# Patient Record
Sex: Female | Born: 1950 | Race: White | Hispanic: No | Smoking: Never smoker
Health system: Southern US, Community
[De-identification: ages and names within clinical notes are randomized; demographics above are authoritative.]

## PROBLEM LIST (undated history)

## (undated) DIAGNOSIS — I87009 Postthrombotic syndrome without complications of unspecified extremity: Secondary | ICD-10-CM

## (undated) DIAGNOSIS — R0600 Dyspnea, unspecified: Secondary | ICD-10-CM

## (undated) DIAGNOSIS — J189 Pneumonia, unspecified organism: Secondary | ICD-10-CM

## (undated) DIAGNOSIS — I2699 Other pulmonary embolism without acute cor pulmonale: Secondary | ICD-10-CM

## (undated) DIAGNOSIS — J45909 Unspecified asthma, uncomplicated: Secondary | ICD-10-CM

## (undated) DIAGNOSIS — J309 Allergic rhinitis, unspecified: Secondary | ICD-10-CM

## (undated) DIAGNOSIS — I10 Essential (primary) hypertension: Secondary | ICD-10-CM

## (undated) HISTORY — DX: Unspecified asthma, uncomplicated: J45.909

## (undated) HISTORY — PX: SHOULDER ARTHROSCOPY: SHX128

## (undated) HISTORY — PX: VESICOVAGINAL FISTULA CLOSURE W/ TAH: SUR271

## (undated) HISTORY — PX: BLADDER REPAIR: SHX76

## (undated) HISTORY — DX: Allergic rhinitis, unspecified: J30.9

## (undated) HISTORY — DX: Other pulmonary embolism without acute cor pulmonale: I26.99

## (undated) HISTORY — PX: APPENDECTOMY: SHX54

## (undated) HISTORY — DX: Postthrombotic syndrome without complications of unspecified extremity: I87.009

## (undated) HISTORY — PX: TONSILLECTOMY: SUR1361

---

## 2001-05-27 ENCOUNTER — Other Ambulatory Visit: Admission: RE | Admit: 2001-05-27 | Discharge: 2001-05-27 | Payer: Self-pay | Admitting: Family Medicine

## 2001-08-03 ENCOUNTER — Encounter (INDEPENDENT_AMBULATORY_CARE_PROVIDER_SITE_OTHER): Payer: Self-pay

## 2001-08-03 ENCOUNTER — Inpatient Hospital Stay (HOSPITAL_COMMUNITY): Admission: RE | Admit: 2001-08-03 | Discharge: 2001-08-06 | Payer: Self-pay | Admitting: Obstetrics and Gynecology

## 2002-01-12 ENCOUNTER — Inpatient Hospital Stay (HOSPITAL_COMMUNITY): Admission: EM | Admit: 2002-01-12 | Discharge: 2002-01-15 | Payer: Self-pay | Admitting: Emergency Medicine

## 2002-01-12 ENCOUNTER — Encounter: Payer: Self-pay | Admitting: Internal Medicine

## 2003-03-11 ENCOUNTER — Encounter: Payer: Self-pay | Admitting: Emergency Medicine

## 2003-03-11 ENCOUNTER — Emergency Department (HOSPITAL_COMMUNITY): Admission: EM | Admit: 2003-03-11 | Discharge: 2003-03-12 | Payer: Self-pay | Admitting: Emergency Medicine

## 2005-06-15 ENCOUNTER — Encounter: Payer: Self-pay | Admitting: Emergency Medicine

## 2005-06-15 ENCOUNTER — Inpatient Hospital Stay (HOSPITAL_COMMUNITY): Admission: AD | Admit: 2005-06-15 | Discharge: 2005-06-23 | Payer: Self-pay | Admitting: Internal Medicine

## 2005-08-05 ENCOUNTER — Ambulatory Visit: Payer: Self-pay | Admitting: Internal Medicine

## 2005-09-18 ENCOUNTER — Ambulatory Visit: Payer: Self-pay | Admitting: Internal Medicine

## 2005-11-21 ENCOUNTER — Ambulatory Visit: Payer: Self-pay | Admitting: Internal Medicine

## 2006-05-22 ENCOUNTER — Ambulatory Visit: Payer: Self-pay | Admitting: Internal Medicine

## 2006-12-02 ENCOUNTER — Ambulatory Visit: Payer: Self-pay | Admitting: Internal Medicine

## 2007-09-18 ENCOUNTER — Inpatient Hospital Stay (HOSPITAL_COMMUNITY): Admission: EM | Admit: 2007-09-18 | Discharge: 2007-09-25 | Payer: Self-pay | Admitting: Emergency Medicine

## 2007-09-20 ENCOUNTER — Telehealth: Payer: Self-pay | Admitting: Internal Medicine

## 2007-10-04 DIAGNOSIS — J4540 Moderate persistent asthma, uncomplicated: Secondary | ICD-10-CM

## 2007-10-04 DIAGNOSIS — J302 Other seasonal allergic rhinitis: Secondary | ICD-10-CM

## 2007-10-04 DIAGNOSIS — M810 Age-related osteoporosis without current pathological fracture: Secondary | ICD-10-CM | POA: Insufficient documentation

## 2007-10-04 DIAGNOSIS — J3089 Other allergic rhinitis: Secondary | ICD-10-CM

## 2007-10-05 ENCOUNTER — Ambulatory Visit: Payer: Self-pay | Admitting: Internal Medicine

## 2007-10-05 DIAGNOSIS — I2699 Other pulmonary embolism without acute cor pulmonale: Secondary | ICD-10-CM | POA: Insufficient documentation

## 2007-12-07 ENCOUNTER — Ambulatory Visit: Payer: Self-pay | Admitting: Internal Medicine

## 2008-02-14 ENCOUNTER — Ambulatory Visit (HOSPITAL_COMMUNITY): Admission: RE | Admit: 2008-02-14 | Discharge: 2008-02-14 | Payer: Self-pay | Admitting: Orthopedic Surgery

## 2008-02-25 ENCOUNTER — Ambulatory Visit (HOSPITAL_COMMUNITY): Admission: RE | Admit: 2008-02-25 | Discharge: 2008-02-25 | Payer: Self-pay | Admitting: Orthopedic Surgery

## 2008-03-14 ENCOUNTER — Ambulatory Visit: Payer: Self-pay | Admitting: Internal Medicine

## 2009-01-02 ENCOUNTER — Observation Stay (HOSPITAL_COMMUNITY): Admission: EM | Admit: 2009-01-02 | Discharge: 2009-01-03 | Payer: Self-pay | Admitting: Emergency Medicine

## 2009-01-02 ENCOUNTER — Encounter: Payer: Self-pay | Admitting: Internal Medicine

## 2009-01-04 ENCOUNTER — Telehealth: Payer: Self-pay | Admitting: Internal Medicine

## 2009-01-05 ENCOUNTER — Ambulatory Visit: Payer: Self-pay | Admitting: Internal Medicine

## 2009-01-05 DIAGNOSIS — L509 Urticaria, unspecified: Secondary | ICD-10-CM

## 2009-05-08 ENCOUNTER — Ambulatory Visit: Payer: Self-pay | Admitting: Internal Medicine

## 2009-05-08 LAB — CONVERTED CEMR LAB: IgE (Immunoglobulin E), Serum: 25.4 intl units/mL (ref 0.0–180.0)

## 2009-09-25 ENCOUNTER — Ambulatory Visit: Payer: Self-pay | Admitting: Internal Medicine

## 2010-04-22 ENCOUNTER — Ambulatory Visit: Payer: Self-pay | Admitting: Internal Medicine

## 2010-06-04 ENCOUNTER — Telehealth: Payer: Self-pay | Admitting: Internal Medicine

## 2010-09-03 NOTE — Assessment & Plan Note (Signed)
Summary: 6 months/apc   Primary Andrea [name]/Referring Andrea [name]:  Andrea [name]  CC:  6 month follow up.  Pt states breathing is doing "really well overall."  Occ nonprod cough.  Denies SOB, wheezing, and chest tightness. Marland Kitchen  History of Present Illness:  May 08, 2009- allergic rhinitis, asthma, urticaria No major events since here in June. She does note increased wheezey cough and red eyes starting 3-4 weeks ago, standard for Fall. On some days she uses neb 1-2 times, and we reviewed her meds. No prednisone since ER in June.She works from home without exposures unless she goes outside. Had pneumovax 8 years ago and we discussed waiting til 66 to repeat.She will get flu vax and routine labs at pending annual exam. had CXR at [name] ER in June .We discussed Xolair - never allergy tested.  September 25, 2009- Allergic rhinitis, asthma, urticaria.  Had flu vax in November, but had a flu-like illness at first of February with temp 102, diffuse myalgias, dry cough, no GI. She toolk a prednisone burst which helped. Now resolved. She had a lot of exposure to small children working at [employer]. No routine sheeze or cough. Good peak flow is 500 and she dropped with the recent illness to about 400. She has a full medication tool kit and uses her meds appropriately. Working at home has helped more than anything.  April 22, 2010- Allergic rhinitis, asthma, urticaria By working at home she was able to avoid being out in the heat much. Now with Fall weather has had a little itching of her eyes. Has needed acute benadryl- in  the last 2 years she has noted with rain she would wake itching and wheezing.  She has used nebulizer but not needed ER or Epipen. 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  Flu vaccine discussed.   Asthma History    Initial Asthma Severity Rating:    Age range: 12+ years    Symptoms: >2 days/week; not daily    Nighttime  Awakenings: 0-2/month    Interferes w/ normal activity: no limitations    SABA use (not for EIB): 0-2 days/week    Asthma Severity Assessment: Mild Persistent   Preventive Screening-Counseling & Management  Alcohol-Tobacco     Smoking Status: never  Current Medications (verified): 1)  Allegra 180 Mg  Tabs (Fexofenadine Hcl) .... Take 1 Tablet By Mouth Once A Day 2)  Singulair 10 Mg  Tabs (Montelukast Sodium) .... Take 1 Tablet By Mouth Once A Day 3)  Flonase 50 Mcg/act  Susp (Fluticasone Propionate) .... Use As Directed 4)  Simvastatin 80 Mg  Tabs (Simvastatin) .... Take 1/2  Tab By Mouth At Bedtime 5)  Calcium 500/d 500-200 Mg-Unit  Tabs (Calcium Carbonate-Vitamin D) .... Take 2  Tablet By Mouth Once A Day 6)  Fosamax 70 Mg  Tabs (Alendronate Sodium) .... Once A Week 7)  Spiriva Handihaler 18 Mcg  Caps (Tiotropium Bromide Monohydrate) .... Inhale Contents of 1 Capsule Once A Day 8)  Foradil Aerolizer 12 Mcg  Caps (Formoterol Fumarate) .... Take 1 Capsule By Mouth Two Times A Day 9)  Asmanex 60 Metered Doses 220 Mcg/inh  Aepb (Mometasone Furoate) .... Inhale 1 Puff Two Times A Day 10)  Vitamin D 14782 Unit  Caps (Ergocalciferol) .... Twice Month 11)  Ventolin Hfa 108 (90 Base) Mcg/act Aers (Albuterol Sulfate) .... 2 Puffs Four Times A Day As Needed 12)  Albuterol Sulfate 0.63 Mg/63ml  Nebu (Albuterol Sulfate) .Marland Kitchen.. 1 Neb Four Times A Day As Needed 13)  Epipen 0.3 Mg/0.40ml (1:1000) Devi (Epinephrine Hcl (Anaphylaxis)) .... As Directed For Severe Allergic Episode 14)  Tylenol Extra Strength 500 Mg Tabs (Acetaminophen) .... As Needed  Allergies (verified): 1)  ! * Avelox 2)  ! 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  Father- no medical hx Sibling 1- living age 78; no medical hx Sibling 2- living age 76; severe asthma, bleeding disorder  Social History: Last updated: 03/20/2008 Patient never smoked.  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 ETOH- 1/ month Negative history of passive tobacco smoke exposure.   Risk Factors: Smoking Status: never (04/22/2010) Passive Smoke Exposure: no (03/20/2008)  Review of Systems      See HPI       The patient complains of nasal congestion/difficulty breathing through nose.  The patient denies shortness of breath with activity, shortness of breath at rest, productive cough, non-productive cough, coughing up blood, chest pain, irregular heartbeats, acid heartburn, indigestion, loss of appetite, weight change, abdominal pain, difficulty swallowing, sore throat, tooth/dental problems, headaches, and sneezing.    Vital Signs:  Patient profile:   60 year old female Height:      63.5 inches Weight:      175 pounds BMI:     30.62 O2 Sat:      98 % on Room air Pulse rate:   90 / minute BP sitting:   120 / 90  (left arm) Cuff size:   regular  Vitals Entered By: Andrea Dimitri RN (April 22, 2010 9:21 AM)  O2 Flow:  Room air CC: 6 month follow up.  Pt states breathing is doing "really well overall."  Occ nonprod cough.  Denies SOB, wheezing, chest tightness.  Comments Medications reviewed with patient Daytime contact number verified with patient. Andrea Dimitri RN  April 22, 2010 9:21 AM    Physical Exam  Additional Exam:  General: A/Ox3; pleasant and cooperative, NAD, SKIN: no rash, lesions NODES: no lymphadenopathy HEENT: [place]/AT, EOM- WNL, Conjuctivae- clear,  PERRLA, TM-WNL, Nose- clear, Throat- clear and wnl NECK: Supple w/ fair ROM, JVD- none, normal carotid impulses w/o bruits Thyroid- normal to palpation CHEST: Clear to P&A, no wheeze or cough HEART: RRR, no m/g/r heard ABDOMEN: Soft and nl; overweight DVV:OHYW, nl pulses, chronic stasis changes NEURO: Grossly intact to observation      Impression & Recommendations:  Problem # 1:  URTICARIA (ICD-708.9)  She associates rare/ once a year episode of hives and wheeze with mold exposure as it rains. I don't have a better way to manage for now than what she is doing.   Problem # 2:  ASTHMA (ICD-493.90) Better control. A key issue for her is environmental control that she has at home- avoiding respiratory irritants.  I will let her check coverage on Advair 250 now since she is concerned about cost of Foradil.  Problem # 3:  ALLERGIC RHINITIS (ICD-477.9)  Good control of rhinitis now. Her updated medication list for this problem includes:    Allegra 180 Mg  Tabs (Fexofenadine hcl) .Marland Kitchen... Take 1 tablet by mouth once a day    Flonase 50 Mcg/act Susp (Fluticasone propionate) ..... Use as directed  Medications Added to Medication List This Visit: 1)  Advair Diskus 250-50 Mcg/dose Aepb (Fluticasone-salmeterol) .Marland Kitchen.. 1 puff and rinse, twice daily 2)  Prednisone 10 Mg Tabs (Prednisone) .Marland Kitchen.. 1 tab four times daily x 2 days, 3 times daily x 2 days, 2 times daily x 2 days, 1 time daily x 2 days  Other Orders: Est. Patient Level IV (16109) Admin 1st Vaccine (60454) Flu Vaccine 100yrs + (09811)  Patient Instructions: 1)  Please schedule a follow-up appointment in 6 months. 2)  Script for prednsone burst to hold 3)  Script for Advair 250. Ask your pharmacist how your insurance would handle this, as opposed to Foradil plus Asmanex 4)  Refill Epipen 5)  Flu vax Prescriptions: PREDNISONE 10 MG TABS (PREDNISONE) 1 tab four times daily x 2 days, 3 times daily x 2 days, 2 times daily x 2 days, 1 time  daily x 2 days  #20 x 1   Entered and Authorized by:   Waymon Budge MD   Signed by:   Waymon Budge MD on 04/22/2010   Method used:   Print then Give to Patient   RxID:   [id number] EPIPEN 0.3 MG/0.3ML (1:1000) DEVI (EPINEPHRINE HCL (ANAPHYLAXIS)) as directed for severe allergic episode  #1 x prn   Entered and Authorized by:   Waymon Budge MD   Signed by:   Waymon Budge MD on 04/22/2010   Method used:   Print then Give to Patient   RxID:   [id number] ADVAIR DISKUS 250-50 MCG/DOSE AEPB (FLUTICASONE-SALMETEROL) 1 puff and rinse, twice daily  #1 x prn   Entered and Authorized by:   Waymon Budge MD   Signed by:   Waymon Budge MD on 04/22/2010   Method used:   Print then Give to Patient   RxID:   [id number]  Flu Vaccine Consent Questions     Do you have a history of severe allergic reactions to this vaccine? no    Any prior history of allergic reactions to egg and/or gelatin? no    Do you have a sensitivity to the preservative Thimersol? no    Do you have a past history of Guillan-Barre Syndrome? no    Do you currently have an acute febrile illness? no    Have you ever had a severe reaction to latex? no    Vaccine information given and explained to patient? yes    Are you currently pregnant? no    Lot Number:[id number]   Exp Date:02/01/2011   Site Given  Left Deltoid IM [facility], LPN  April 22, 2010 10:29 AM  Immunization History:  Pneumovax Immunization History:    Pneumovax:  historical (06/04/2001)  .lbflu

## 2010-09-03 NOTE — Assessment & Plan Note (Signed)
Summary: rov/apc   Primary Provider/Referring Provider:  [name]  CC:  Pt here for follow up. Pt c/o T-102 x 2 days first of this month with bodyaches. Pt states is "really well' since last OV.  History of Present Illness: 01/05/09- Allergic rhinitis, asthma, urticaria 3 episodes of urticaria wih itching since March- never before. Blamed "pollen" in March/ April. Latest one was 3 days ago. some wheeze with last episode. Went to Urgent Care and was sent to ER [facility] , kept overnight for obs. Now taking allegra 180, Singulair, and added zyrtec 10 mg yesterday.  Now on 60 mg prednisone daily. Peak flow is back to normal- 480- 500 and no hives or itching.  May 08, 2009- allergic rhinitis, asthma, urticaria No major events since here in June. She does note increased wheezey cough and red eyes starting 3-4 weeks ago, standard for Fall. On some days she uses neb 1-2 times, and we reviewed her meds. No prednisone since ER in June.She works from home without exposures unless she goes outside. Had pneumovax 8 years ago and we discussed waiting til 66 to repeat.She will get flu vax and routine labs at pending annual exam. had CXR at [facility] ER in June .We discussed Xolair - never allergy tested.  September 25, 2009- Allergic rhinitis, asthma, urticaria.  Had flu vax in November, but had a flu-like illness at first of February with temp 102, diffuse myalgias, dry cough, no GI. She toolk a prednisone burst which helped. Now resolved. She had a lot of exposure to small children working at [employer]. No routine sheeze or cough. Good peak flow is 500 and she dropped with the recent illness to about 400. She has a full medication tool kit and uses her meds appropriately. Working at home has helped more than anything.    Current Medications (verified): 1)  Allegra 180 Mg  Tabs (Fexofenadine Hcl) .... Take 1 Tablet By Mouth Once A Day 2)  Singulair 10 Mg  Tabs (Montelukast Sodium) .... Take 1 Tablet By Mouth  Once A Day 3)  Flonase 50 Mcg/act  Susp (Fluticasone Propionate) .... Use As Directed 4)  Simvastatin 80 Mg  Tabs (Simvastatin) .... Take 1/2  Tab By Mouth At Bedtime 5)  Calcium 500/d 500-200 Mg-Unit  Tabs (Calcium Carbonate-Vitamin D) .... Take 2  Tablet By Mouth Once A Day 6)  Fosamax 70 Mg  Tabs (Alendronate Sodium) .... Once A Week 7)  Spiriva Handihaler 18 Mcg  Caps (Tiotropium Bromide Monohydrate) .... Inhale Contents of 1 Capsule Once A Day 8)  Foradil Aerolizer 12 Mcg  Caps (Formoterol Fumarate) .... Take 1 Capsule By Mouth Two Times A Day 9)  Asmanex 60 Metered Doses 220 Mcg/inh  Aepb (Mometasone Furoate) .... Inhale 1 Puff Two Times A Day 10)  Vitamin D 16109 Unit  Caps (Ergocalciferol) .... Twice Month 11)  Ventolin Hfa 108 (90 Base) Mcg/act Aers (Albuterol Sulfate) .... 2 Puffs Four Times A Day As Needed 12)  Albuterol Sulfate 0.63 Mg/78ml  Nebu (Albuterol Sulfate) .Marland Kitchen.. 1 Neb Four Times A Day As Needed 13)  Epipen 0.3 Mg/0.42ml (1:1000) Devi (Epinephrine Hcl (Anaphylaxis)) .... As Directed For Severe Allergic Episode 14)  Tylenol Extra Strength 500 Mg Tabs (Acetaminophen) .... As Needed  Allergies: 1)  ! * Avelox 2)  ! 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 ETOH- 1/ month Negative history of passive tobacco smoke exposure.   Risk Factors: Smoking Status: never (10/05/2007) Passive Smoke Exposure: no (03/20/2008)  Review of Systems      See HPI  The patient denies anorexia, fever, weight loss, weight gain, vision loss, decreased hearing, hoarseness, chest pain, syncope, dyspnea on exertion, peripheral edema, prolonged cough, headaches, hemoptysis, abdominal pain, and severe indigestion/heartburn.    Vital Signs:  Patient profile:   60 year old female Height:      64 inches Weight:      173.50 pounds O2 Sat:      93 % on Room air Pulse rate:   88 / minute BP sitting:   138 / 92  (left arm) Cuff size:   regular  Vitals Entered By: Zackery Barefoot CMA (September 25, 2009 10:18 AM)  O2 Flow:  Room air CC: Pt here for follow up. Pt c/o T-102 x 2 days first of this month with bodyaches. Pt states is "really well' since last OV Comments Medications reviewed with patient Verified contact number and pharmacy with patient Zackery Barefoot CMA  September 25, 2009 10:19 AM    Physical Exam  Additional Exam:  General: A/Ox3; pleasant and cooperative, NAD, SKIN: no rash, lesions NODES: no lymphadenopathy HEENT: [place]/AT, EOM- WNL, Conjuctivae- clear, PERRLA, TM-WNL, Nose- clear, Throat- clear and wnl NECK: Supple w/ fair ROM, JVD- none, normal carotid impulses w/o bruits Thyroid- normal to palpation CHEST: Clear to P&A HEART: RRR, no m/g/r heard ABDOMEN: Soft and nl;  NGE:XBMW, nl pulses, chronic stasis changes NEURO: Grossly intact to observation      Impression & Recommendations:  Problem # 1:  ASTHMA (ICD-493.90) We have discussed prednisone, which she used appropriately. Her latest illness was almost certainly flu, despite her vaccination. If I had known about it I would have offerred Tamiflu. She is back to baseline and needs no changes for now.  Problem # 2:  Hx of PULMONARY EMBOLISM  (ICD-415.19)  Controlled. post phlebitic syndrome unchanged  Medications Added to Medication List This Visit: 1)  Tylenol Extra Strength 500 Mg Tabs (Acetaminophen) .... As needed  Other Orders: Est. Patient Level III (41324)  Patient Instructions: 1)  Please schedule a follow-up appointment in 6 months. 2)  Please call as needed   Immunization History:  Influenza Immunization History:    Influenza:  historical (06/04/2009)

## 2010-09-03 NOTE — Progress Notes (Signed)
Summary: refills  Phone Note Call from Patient   Caller: [phone] Call For: young Reason for Call: Talk to Nurse Summary of Call: Requesting refill on singular, asmanex, and spiriva.  [employer]--francis king Initial call taken by: Lehman Prom,  June 04, 2010 2:21 PM  Follow-up for Phone Call        refills sent. pt aware. Carron Curie CMA  June 04, 2010 3:36 PM     Prescriptions: ASMANEX 60 METERED DOSES 220 MCG/INH  AEPB (MOMETASONE FUROATE) Inhale 1 puff two times a day  #1 x prn   Entered by:   Carron Curie CMA   Authorized by:   Waymon Budge MD   Signed by:   Carron Curie CMA on 06/04/2010   Method used:   Electronically to        [facility]* (retail)       [address] [place], [place]  [address]       Ph: [phone]       Fax: [phone]   RxID:   [id number] SPIRIVA HANDIHALER 18 MCG  CAPS (TIOTROPIUM BROMIDE MONOHYDRATE) Inhale contents of 1 capsule once a day  #30 x prn   Entered by:   Carron Curie CMA   Authorized by:   Waymon Budge MD   Signed by:   Carron Curie CMA on 06/04/2010   Method used:   Electronically to        [facility]* (retail)       [address] [place], [place]  [address]       Ph: [phone]       Fax: [phone]   RxID:   [id number] SINGULAIR 10 MG  TABS (MONTELUKAST SODIUM) Take 1 tablet by mouth once a day  #30 x prn   Entered by:   Carron Curie CMA   Authorized by:   Waymon Budge MD   Signed by:   Carron Curie CMA on 06/04/2010   Method used:   Electronically to        [facility]* (retail)       [address] [place], [place]  [address]       Ph: [phone]       Fax: [phone]   RxID:   [id number]

## 2010-10-25 ENCOUNTER — Ambulatory Visit (INDEPENDENT_AMBULATORY_CARE_PROVIDER_SITE_OTHER): Payer: 59 | Admitting: Internal Medicine

## 2010-10-25 ENCOUNTER — Encounter: Payer: Self-pay | Admitting: Internal Medicine

## 2010-10-25 ENCOUNTER — Other Ambulatory Visit: Payer: Self-pay | Admitting: Internal Medicine

## 2010-10-25 ENCOUNTER — Other Ambulatory Visit: Payer: Self-pay

## 2010-10-25 VITALS — BP 126/80 | HR 88 | Ht 63.5 in | Wt 184.8 lb

## 2010-10-25 DIAGNOSIS — L509 Urticaria, unspecified: Secondary | ICD-10-CM

## 2010-10-25 DIAGNOSIS — J309 Allergic rhinitis, unspecified: Secondary | ICD-10-CM

## 2010-10-25 DIAGNOSIS — J45909 Unspecified asthma, uncomplicated: Secondary | ICD-10-CM

## 2010-10-25 DIAGNOSIS — I2699 Other pulmonary embolism without acute cor pulmonale: Secondary | ICD-10-CM

## 2010-10-25 NOTE — Patient Instructions (Signed)
Given samples Advair 500 x 2 weeks  1 puff and rinse mouth well, twice daily After these are used up go back to Foradil and Asmanex  Lab for IgE level  Call as needed for refills.

## 2010-10-25 NOTE — Progress Notes (Signed)
  Subjective:    Patient ID: Andrea [name], female    DOB: September 15, 1950, 60 y.o.   MRN: [id number]  HPI 59yoF never smoker here for f/u of allergic rhinitis and asthma, with hx DVT/PE. Last here April 22, 2010. She is trying not to go outside much in this pollen season. Still works at home, which has reduced her sick days and ER trips (none since 2010). Uses rescue inhaler usually about 3-4 x/ week. In last week she is using it or the nebulizer 2-3x/ day. She is using Foradil and Asmanex as cheaper than Advair.. Last prednisone was with a cold in February. She feels her status now is typical for this time of year. She has never used allergy vaccine or Xolair. Last hosp 2006.  IgE level 05/08/2009 was 25.4 with no elevations on allergy profile. Wakes some nights, tight, needing neb or rescue inhaler.   Review of Systems Constitutional:   No weight loss, night sweats,  Fevers, chills, fatigue, lassitude. HEENT:   No headaches,  Difficulty swallowing,  Tooth/dental problems,  Sore throat,                No sneezing, itching, ear ache, nasal congestion, post nasal drip,   CV:  No chest pain,  Orthopnea, PND, swelling in lower extremities, anasarca, dizziness, palpitations  GI  No heartburn, indigestion, abdominal pain, nausea, vomiting, diarrhea, change in bowel habits, loss of appetite  Resp: No shortness of breath r at rest.  No excess mucus, no productive cough, Yes- Non-productive cough,  No coughing up of blood.  No change in color of mucus.  No wheezing.    Skin: no rash or lesions.  GU: no dysuria, change in color of urine, no urgency or frequency.  No flank pain.  MS:  No joint pain or swelling.  No decreased range of motion.  No back pain.  Psych:  No change in mood or affect. No depression or anxiety.  No memory loss.     Objective:   Physical Exam General- Alert, Oriented, Affect-appropriate, Distress- none acute, overweight  Skin- rash-none, lesions- none, excoriation-  none  Lymphadenopathy- none  Head- atraumatic  Eyes- Gross vision intact, PERRLA, conjunctivae clear, secretions  Ears- Normal Hearing, canals, Tm L ,   R ,  Nose- Clear, Septal dev, mucus, polyps, erosion, perforation   Throat- Mallampati III , mucosa clear , drainage- none, tonsils- atrophic  Neck- flexible , trachea midline, no stridor , thyroid nl, carotid no bruit  Chest - symmetrical excursion , unlabored     Heart/CV- RRR , no murmur , no gallop  , no rub, nl s1 s2                     - JVD- none , edema- none, stasis changes- none, varices- none     Lung- clear to P&A, wheeze- none, cough- active, dry ; dullness-none, rub- none     Chest wall-   Abd- tender-no, distended-no, bowel sounds-present, HSM- no  Br/ Gen/ Rectal- Not done, not indicated  Extrem- cyanosis- none, clubbing, none, atrophy- none, strength- nl  Neuro- grossly intact to observation        Assessment & Plan:

## 2010-10-25 NOTE — Assessment & Plan Note (Signed)
Definite seasonal flare in rhinoconjunctivitis symptoms. We discussed antihistamines and otc eyedrops.

## 2010-10-25 NOTE — Assessment & Plan Note (Signed)
Control now is inadequate on Foradil and Asmanex. She had changed from Advair due to cost. We will give 2 weeks of Advair 500 if samples are available, and will recheck an IgE now in pollen season with question of xolair candidacy.

## 2010-10-25 NOTE — Assessment & Plan Note (Signed)
She has had urticaria in June the last 2 years- unknown trigger, possibly heat.

## 2010-10-26 LAB — IGE: IgE (Immunoglobulin E), Serum: 13.9 IU/mL (ref 0.0–180.0)

## 2010-10-27 NOTE — Assessment & Plan Note (Addendum)
Non-recurrent. Will remain at increased long term risk. Knows to avoid stagnation.

## 2010-10-28 ENCOUNTER — Other Ambulatory Visit: Payer: Self-pay | Admitting: *Deleted

## 2010-10-28 MED ORDER — FLUTICASONE PROPIONATE 50 MCG/ACT NA SUSP: NASAL | Status: DC

## 2010-11-07 ENCOUNTER — Telehealth: Payer: Self-pay | Admitting: Internal Medicine

## 2010-11-07 MED ORDER — FLUTICASONE-SALMETEROL 500-50 MCG/DOSE IN AEPB: INHALATION_SPRAY | RESPIRATORY_TRACT | Status: DC

## 2010-11-07 NOTE — Telephone Encounter (Signed)
Advised pt rx was sent to pharmacy. Pt verbalized understanding

## 2010-11-07 NOTE — Telephone Encounter (Signed)
Spoke w/ pt and when she was last seen 3/23 she was given a 2 week trial of advair 500. Pt states she feels like this helps her better than the foradil and the asmanex. Pt can tell her breathing is better with using the advair and she does not have to use her rescue inhaler as much as she did when she was using the foradil and asmanex. Pt is requesting to have advair 500 called in for her. Please advise Dr. Maple Hudson. Thanks  Carver Fila, CMA

## 2010-11-07 NOTE — Telephone Encounter (Signed)
Per CDY-ok Advair 500/50 #1 1 puff and RINSE well twice daily with year refills.[name]

## 2010-11-11 LAB — DIFFERENTIAL
Eosinophils Relative: 0 % (ref 0–5)
Lymphocytes Relative: 7 % — ABNORMAL LOW (ref 12–46)
Lymphs Abs: 1.1 10*3/uL (ref 0.7–4.0)

## 2010-11-11 LAB — BASIC METABOLIC PANEL
BUN: 10 mg/dL (ref 6–23)
GFR calc Af Amer: >60 mL/min (ref >60)
GFR calc non Af Amer: 51 mL/min — ABNORMAL LOW (ref >60)
Potassium: 3.2 mEq/L — ABNORMAL LOW (ref 3.5–5.1)
Sodium: 140 mEq/L (ref 135–145)

## 2010-11-11 LAB — URINALYSIS, ROUTINE W REFLEX MICROSCOPIC
Glucose, UA: NEGATIVE mg/dL
Ketones, ur: NEGATIVE mg/dL
pH: 6 (ref 5.0–8.0)

## 2010-11-11 LAB — CBC
HCT: 40.5 % (ref 36.0–46.0)
Hemoglobin: 13.7 g/dL (ref 12.0–15.0)
Platelets: 215 10*3/uL (ref 150–400)
RBC: 4.6 MIL/uL (ref 3.87–5.11)
WBC: 15.4 10*3/uL — ABNORMAL HIGH (ref 4.0–10.5)

## 2010-12-04 ENCOUNTER — Other Ambulatory Visit: Payer: Self-pay | Admitting: Internal Medicine

## 2010-12-17 NOTE — Discharge Summary (Signed)
Andrea [name], [name]            ACCOUNT NO.:  [id number]   MEDICAL RECORD NO.:  [id number]          PATIENT TYPE:  INP   LOCATION:  6706                         FACILITY:  [facility]   PHYSICIAN:  Ramiro Harvest, MD    DATE OF BIRTH:  [birth year]   DATE OF ADMISSION:  09/18/2007  DATE OF DISCHARGE:  09/25/2007                               DISCHARGE SUMMARY   PRIMARY CARE PHYSICIAN:  Dr. [name] of [facility].   PULMONOLOGIST:  Dr. Fannie Knee of [facility].   DISCHARGE DIAGNOSES:  1. Acute asthmatic bronchitic exacerbation.  2. Hypokalemia.  3. Allergic rhinitis.  4. Hyperlipidemia.  5. Osteoporosis.  6. History of abnormal diffusion capacity/previous pulmonary embolus.  7. Post phlebitic syndrome.   DISCHARGE MEDICATIONS:  1. Doxycycline 100 mg p.o. b.i.d. x1 week.  2. Mucinex 1200 mg p.o. b.i.d. x1 week.  3. Prednisone 60 mg p.o. b.i.d. x3 days, then 40 mg p.o. b.i.d. x3      days, then 60 mg p.o. daily x3 days, then 40 mg p.o. daily x3 days,      then 20 mg p.o. daily x3 days, then off.  4. Albuterol nebulizer q.8h. x5 days, then as needed.  5. Singular 10 mg p.o. daily.  6. Allegra 180 mg p.o. daily.  7. Simvastatin 80 mg p.o. daily.  8. Spiriva 18 mcg inhalation daily.  9. Fosamax weekly.  10.Foradil Aerolizer as previously taken.  11.Calcium daily.  12.Xopenex inhaler p.r.n.  13.Asmanex one tablet p.o. b.i.d.   DISPOSITION AND FOLLOW-UP:  The patient will be discharged home to  follow up with her primary care physician, Dr. [name], as  scheduled on Wednesday, September 29, 2007.  Basic metabolic profile  needs to be checked to follow up on the patient's electrolytes.  The  patient is also to call to schedule a follow-up appointment with her  pulmonologist, Dr. Fannie Knee of [facility], in 1 week.   CONSULTATIONS DONE:  None.   PROCEDURES DONE:  A chest x-ray was obtained on September 18, 2007, which  showed bronchitis without  consolidation or collapse.   ADMISSION HISTORY AND PHYSICAL:  [name] is a 60 year old  female, past medical history significant for asthma, allergic rhinitis,  history of abnormal diffusion capacity, prior PE per pulmonologist,  postphlebitic syndrome, osteoporosis who presented with a persistent  cough and shortness of breath.  The patient stated that the first week  of January she developed some cold symptoms and since then has continued  to have a nonproductive cough and has been short of breath as well.  The  patient stated that for the past 3 weeks she had been on a nebulizer  bronchodilator four times daily and on prednisone for 4 weeks.  The  patient also had received two courses of Z-Pak.  Three days prior to  admission the patient was started on Augmentin by her PCP.  Because the  patient's cough and shortness of breath were persistent, she called her  primary care physician on the day of presentation and was to told to  come to the emergency room.  In  the emergency room the patient received  nebulized bronchodilators as well as IV Solu-Medrol and antibiotics but  symptoms did not improve so the patient was admitted for further  evaluation and management.  Chest x-ray done in the ED revealed  bronchitis/bronchial thickening without consultation or collapse with  results as stated above.   PHYSICAL EXAMINATION:  VITAL SIGNS: Temperature 98.5, blood pressure  104/32, pulse of 100, respiratory rate 16, saturating 98%.  HEENT:  Normocephalic, atraumatic.  Pupils equal, round and reactive to  light.  Extraocular movements intact.  Sclerae were anicteric.  Moist  mucous membranes.  No oral exudates.  LUNGS:  With decreased air movement bilaterally.  No wheezes, no  crackles.  CARDIOVASCULAR:  Regular rate and rhythm.  Normal S1, S2.  ABDOMEN:  Soft, positive bowel sounds, nontender, nondistended, no  organomegaly, no masses palpable.  EXTREMITIES:  No clubbing,  cyanosis or edema.   ADMISSION LABORATORIES:  Chest x-ray as stated above.  CBC:  White count  6.9, hemoglobin 13.6, hematocrit 40.1, platelet count 163.  Sodium 134,  potassium 4.3, chloride 98, bicarb 27, glucose 126, BUN 18, creatinine  1.08, total bilirubin of 1.8, alk phosphatase 65, AST 34, ALT 25.   HOSPITAL COURSE:  1. Asthmatic bronchitis exacerbation.  The patient had failed      outpatient management with antibiotics, oral prednisone and      nebulizer and as such, the patient was admitted to telemetry.  The      patient was placed on nebulizer bronchodilators, placed on IV      antibiotics of her Zosyn and IV Solu-Medrol.  The patient was also      continued on her singular as well as Mucinex for her cough.  The      patient's steroids were tapered slowly.  The patient improved      slowly on a daily basis.  Pre and post peak bronchodilator flow      rates were monitored during the hospitalization and the patient      went as high as a post of 400.  The patient continued to improve      daily.  The patient's IV steroids were slowly titrated down.  The      patient's antibiotics were subsequently changed to oral doxycycline      100 mg twice daily which the patient seemed to tolerate.  The      patient will be continued on her antibiotics of doxycycline as an      outpatient to complete a 2-week course of antibiotic therapy.  The      patient will be discharged home on a slow steroid taper, nebulizers      for nebulizer treatment, antibiotics, Mucinex and Singulair.  The      patient will follow up with PCP as scheduled and also follow up      with her pulmonologist, Dr. Fannie Knee, and will be discharged in      stable and improved condition.  2. Hypokalemia.  Was likely secondary to increased use of nebulizer      treatments.  The patient's potassium was repleted and the patient      will be discharged in stable and improved condition.   The rest of the patient's chronic  medical issues were stable throughout  the hospitalization and on day of discharge the patient was in stable  and improved condition.   VITAL SIGNS ON DAY OF DISCHARGE:  Temperature 97.6, pulse of 97, blood  pressure 119/82, respiratory rate 20, saturating 99% on room air.   DISCHARGE LABORATORIES:  Sodium 140, potassium 4.6, chloride 104, bicarb  29, BUN 18, creatinine 0.98, glucose of 124 and calcium of 8.3.   It has been a pleasure taking care of [name].      Ramiro Harvest, MD  Electronically Signed     DT/MEDQ  D:  09/25/2007  T:  09/26/2007  Job:  [id number]   cc:   Pam Drown, M.D.  Clinton D. Maple Hudson, MD, FCCP, FACP

## 2010-12-17 NOTE — H&P (Signed)
Andrea [name], Andrea [name]            ACCOUNT NO.:  [id number]   MEDICAL RECORD NO.:  [id number]          PATIENT TYPE:  INP   LOCATION:  6702                         FACILITY:  [facility]   PHYSICIAN:  Kela Millin, M.D.DATE OF BIRTH:  1951/04/07   DATE OF ADMISSION:  09/18/2007  DATE OF DISCHARGE:                              HISTORY & PHYSICAL   PRIMARY CARE PHYSICIAN:  Pam Drown, M.D.   CHIEF COMPLAINT:  Persistent cough and shortness of breath.   HISTORY OF PRESENT ILLNESS:  The patient is a 60 year old white female  with past medical history significant for asthma, allergic rhinitis,  history of abnormal diffusion capacity/perfuse PE (per pulmonologist),  post phlebitic syndrome, osteoporosis.  She presents with the above  complaint.  She states that the first week of January she developed some  cold symptoms, and since then she has continued to cough  (nonproductive), and has been short of breath as well.  She states that  for the past 3 weeks she has been on nebulized bronchodilators, four  times a day; and on prednisone for 4 weeks.  She also has received 2  courses of Z-pack.  About 3 days ago she was started on Augmentin by her  primary care physician.  Because the cough and shortness of breath were  persisting, she called her primary care physician on the day of  presentation; and was asked to come to the ER.   In the ER she received nebulized bronchodilators, as well as Solu-Medrol  and antibiotics; but her symptoms did not improve.  So, she is admitted  for further evaluation and management.   A chest x-ray done in the ER revealed bronchitis/bronchial thickening  without consolidation or collapse.   PAST MEDICAL HISTORY:  As above.   MEDICATIONS:  1. Xopenex inhaler.  2. Albuterol.  3. Simvastatin 80 mg.  4. Asmanex one b.i.d.  5. Singulair 10 mg daily.  6. Augmentin.  7. Foradil Aerolizer.  8. Prednisone.  9. Allegra 180.  10.Spiriva.  11.Fosamax.  12.Calcium.   ALLERGIES:  VIOXX.   SOCIAL HISTORY:  She denies tobacco; also denies alcohol.   FAMILY HISTORY:  Her sister has asthma.  A sister also has diabetes.   FAMILY HISTORY:  Also positive for hypertension and heart disease.   REVIEW OF SYSTEMS:  As per HPI, other review of systems negative.   PHYSICAL EXAMINATION:  The patient is a middle-aged white female; she is  alert and appropriate.  No accessory muscle use.  VITAL SIGNS:  Temperature 98.5, blood pressure 104/32, pulse 100  (initially 129), respiratory rate 16, O2 saturation 98%.  HEENT:  PERRL.  EOMI.  Sclerae anicteric.  Moist mucous membranes and no  oral exudates.  LUNGS:  Decreased air movement bilaterally.  No wheezes.  No crackles.  CARDIOVASCULAR:  Regular rate and rhythm.  Normal S1 and S2.  ABDOMEN:  Soft, bowel sounds present.  Nontender, nondistended.  No  organomegaly and no masses palpable.  EXTREMITIES:  No cyanosis and no edema.   LABORATORY DATA:  Chest x-ray:  As per HPI.  White cell count 6.9,  hemoglobin 13.6, hematocrit 40.1, platelet count 163.  Sodium 134,  potassium 4.3, chloride 98, CO2 27, glucose 126, BUN 18, creatinine  1.08.  Total bilirubin 1.8, alkaline phosphatase 65, SGOT 34, SGPT 25.   ASSESSMENT AND PLAN:  1. ASTHMATIC BRONCHITIS EXACERBATION.  Failed outpatient management,      antibiotics, prednisone and nebulizers.  Will continue nebulized      bronchodilators, IV antibiotics and Solu-Medrol.  Continue      Singulair.  2. HISTORY OF ALLERGIC RHINITIS.  Will continue Allegra.  3. HISTORY OF HYPERLIPIDEMIA.  Continue Zocor.  4. HISTORY OF OSTEOPOROSIS.  Follow and continue Fosamax.      Kela Millin, M.D.  Electronically Signed     ACV/MEDQ  D:  09/19/2007  T:  09/19/2007  Job:  [id number]   cc:   Pam Drown, M.D.  Clinton D. Maple Hudson, MD, FCCP, FACP

## 2010-12-20 NOTE — H&P (Signed)
Andrea [name], Andrea [name]            ACCOUNT NO.:  [id number]   MEDICAL RECORD NO.:  [id number]          PATIENT TYPE:  EMS   LOCATION:  ED                           [facility]ACILITY[facility]  [facility]   PHYSICIAN:  Jackie Plum, M.D.DATE OF BIRTH:  01/06/1951   DATE OF ADMISSION:  06/14/2005  DATE OF DISCHARGE:                                HISTORY & PHYSICAL   CHIEF COMPLAINT:  Dyspnea.   HISTORY OF PRESENT ILLNESS:  The patient presents with 10-day history of  progressively worsening shortness of breath associated with cough productive  of yellowish sputum.  She has had low grade fever without chills.  She has  not had any chest pain, nausea, or vomiting.  The patient had been seen in  the office and treated with an unknown antibiotic without any significant  relief and therefore, she came to the ED for further evaluation.  In the  emergency room, the patient was seen by Doug Sou, M.D. who admitted,  nebulized without any significant improvement.  X-ray of the chest did not  reveal any acute infiltrate.  He therefore advised the patient to be  admitted for further inpatient management.   PAST MEDICAL HISTORY:  She is positive for history of asthmatic bronchitis.  There is no history of heart disease.   MEDICATIONS:  1.  Flonase.  2.  Advair.  3.  DuoNeb nebulizers.   ALLERGIES:  She is intolerant to VIOXX.   SOCIAL HISTORY:  The patient does not smoke cigarette nor drink alcohol.   FAMILY HISTORY:  Positive for heart disease.   REVIEW OF SYSTEMS:  As noted above, otherwise unremarkable.   PHYSICAL EXAMINATION:  VITAL SIGNS:  Blood pressure 107/62, pulse 106,  temperature 97.7 degrees Fahrenheit, O2 saturation 100%, 98% on 4 liters of  oxygen by nasal cannula.  GENERAL:  The patient was in mild respiratory distress.  HEENT:  Oropharynx moist.  NECK:  Supple with no JVD.  LUNGS:  The patient had diffuse bilateral wheezes without crackles  appreciated.  HEART:  The patient was  mildly tachycardic.  ABDOMEN:  Soft, bowel sounds present.  EXTREMITIES:  No cyanosis, no edema.  NEUROLOGY:  The patient is alert and oriented x3.  No acute focal deficit.   LABORATORY DATA:  X-ray of the chest as dictated above.  No other lab work  was obtained.   IMPRESSION:  Asthmatic bronchitis.   PLAN:  The patient is admitted for nebulization, steroids, antibiotics.  We  will obtain a 12-lead EKG and check a set of cardiac enzymes.  We will also  obtain a full panel of chemistries, CBC, and further recommendations will be  made after __________.      Jackie Plum, M.D.  Electronically Signed     GO/MEDQ  D:  06/15/2005  T:  06/15/2005  Job:  [id number]   cc:   Pam Drown, M.D.  Fax: [phone]

## 2010-12-20 NOTE — H&P (Signed)
[facility]  Patient:    Andrea [name], Andrea [name] Visit Number: [id number] MRN: [id number]          Service Type: MED Location: 3W 0380 01 Attending Physician:  Jerl Santos Dictated by:   Jackie Plum, M.D. Admit Date:  01/11/2002                           History and Physical  CHIEF COMPLAINT:  Spells of coughing and dyspnea.  HISTORY OF PRESENT ILLNESS:  The patient is a 60 year old Caucasian lady with a history of bronchial asthma, never intubated, not steroid dependent who presented to the [facility] yesterday evening on account of four to five day history of coughing spells without any sputum production.  She gave a history of fever and dyspnea especially on exertion.  She has been feeling generally weak over the last few day with decreased appetite and decreased p.o. intake.  I was called by Dr. Roseanne Reno for admission on account of dyspnea on exertion and tachypnea.  According to Dr. Roseanne Reno he did a chest x-ray (I have no access to this at the time of my evaluation), which revealed interstitial infiltrate pattern.  Per Dr. Roseanne Reno, he described these findings by Dr. Orvan Falconer of interstitial disease for suspected severe acute respiratory syndrome, because the patient just returned from [place].  He was advised by Dr. Orvan Falconer to send the patient for observation and that suspected that SARS was unlikely.  PAST MEDICAL HISTORY:  The patient has a history of bronchial asthma, never intubated, nonsteroid independent.  MEDICATIONS:  She takes Advair and albuterol for her asthma.  FAMILY HISTORY:  Significant for heart disease and stroke.  SOCIAL HISTORY:  She is divorced.  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  She does not smoke cigarettes.  She drinks alcohol socially on occasion.  REVIEW OF SYSTEMS:  The patient denies any contact with people with chronic coughing during her visit to [place].  She has been having  some upper respiratory symptoms over the last few days with fever and postnasal drip.  In addition, she has a complaint of frequency of urination without dysuria.  She denies any history of dyspnea on exertion, orthopnea, palpitations or chest pain.  PHYSICAL EXAMINATION: VITAL SIGNS:  Blood pressure 110/83, heart rate 89 per minute, temperature 100.7 degrees Fahrenheit, respiratory rate 18.  The patient appeared to get very dyspneic on the slightly exertion.  GENERAL:  Notable for middle age lady lying on a stretcher with incessant violent coughing spells.  She was not in acute respiratory distress.  Her O2 saturation was around 98% on room air.  HEENT:  The patient was not pale, not icteric.  Oropharynx exam was notable for some mucosal membrane dryness with absence of any significant exudate or erythema.  Tympanic membranes are within normal limits.  LUNGS:  Notable for occasional rhonchi.  She had fascicular breath sounds which were adequate.  HEART:  Her exam was notable for regular rate and rhythm without any gallops or murmur.  ABDOMEN:  Notable for full abdomen.  She had normoactive bowel sounds.  No hepatosplenomegaly.  EXTREMITIES:  She was not cyanotic.  There was no edema.  CENTRAL NERVOUS SYSTEM:  Alert and oriented x 3 without any obvious abnormalities.  LABORATORY DATA:  Chest x-ray per Dr. Roseanne Reno is as noted in the HPI, i.e., interstitial infiltrates (not available for my review).  Chemistries are pending.  ASSESSMENT:  1. 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 2. Dehydration from decreased p.o. intake. 3. Post nasal drip.  PLAN:  The patient will be admitted for observation.  We will start her on _____  mg IV q.12h. and switch to p.o. hopefully in the morning.  We will offer some steroids, 10 mg of Decadron x1 and then switch to prednisone 40 mg q.d.  We will give her antitussive for symptomatic control of her coughing spells.  We  will offer her Sudafed for post nasal drip and IV fluids for her dehydration.  We will obtain AP and lateral view chest x-ray, CBC, C-MET, and urinalysis.  Dictated by:   Jackie Plum, M.D. Attending Physician:  Jerl Santos DD:  01/12/02 TD:  01/13/02 Job: [id number]

## 2010-12-20 NOTE — Assessment & Plan Note (Signed)
[name]                               PULMONARY OFFICE NOTE   TERIA, KHACHATRYAN                   MRN:          [id number]  DATE:05/22/2006                            DOB:          01-14-1951    PROBLEM LIST:  1. Asthma.  2. Abnormal diffusion capacity/previous pulmonary embolism.  3. Postphlebitic syndrome.  4. Allergic rhinitis.  5. Osteoporosis.   HISTORY OF PRESENT ILLNESS:  The bad air quality days affected her this  summer, and she blames ragweed pollen for bothering her some this fall. 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 Her medications specifically Spiriva, Foradil,  and Asmanex have made a real difference for her she thinks with no  particular problems or complications. There has been no pain or palpitation,  blood or purulent discharge. She got her flu shot at work.   MEDICATIONS:  1. Fexofenadine 180 mg.  2. Singulair 10 mg.  3. Fluticasone 50 mcg once each nostril.  4. Simvastatin 80 mg.  5. Calcium and vitamin D.  6. Fosamax.  7. Spiriva.  8. Foradil 1 puff b.i.d.  9. Asmanex 1 puff b.i.d. (insurance does not cover Advair). She has      Xopenex HFA rescue inhaler and home nebulizer with albuterol. She has      used DuoNeb in the past.   ALLERGIES:  Medication intolerance possibly to AVALOX.   OBJECTIVE:  VITAL SIGNS:  187 pounds, BP 138/88, pulse regular 77, room air  saturation 96%.  GENERAL:  She looks comfortable.  LUNGS:  Breath sounds are diminished but unlabored. There is no dullness, no  pleural rub, and no wheeze or cough.  HEART:  Heart sounds are regular without murmur.  NECK:  Neck veins are nondistended.  HEENT:  Her nose is wet but not obstructed.   IMPRESSION:  1. Seasonal allergic rhinitis.  2. Mild intermittent asthma.  3. Postphlebitic syndrome.   PLAN:  No changes to offer. I refilled her fexofenadine 180 mg. Scheduled to  return in six months or earlier p.r.n.       Clinton D. Maple Hudson, MD, [facility], FACP      CDY/MedQ  DD:  05/23/2006  DT:  05/25/2006  Job #:  [id number]   cc:   Pam Drown, M.D.

## 2010-12-20 NOTE — Discharge Summary (Signed)
[facility]  Patient:    STERLING, UCCI Visit Number: [id number] MRN: [id number]          Service Type: MED Location: 3W 0380 01 Attending Physician:  Hinda Glatter Dictated by:   Pearla Dubonnet, M.D. Admit Date:  01/11/2002 Discharge Date: 01/15/2002   CC:         Vanita Panda, M.D.  Armstead Peaks, M.D.   Discharge Summary  DISCHARGE DIAGNOSES: 1. Asthmatic exacerbation, improved. 2. Urinary tract infection.  DISCHARGE MEDICATIONS: 1. Tequin 400 mg daily for four days. 2. Advair 250/50 one inhalation two to three times a day x 1 week, then b.i.d. 3. Albuterol inhaler two puffs q.4-6h. p.r.n. 4. Prednisone taper 50 mg tapering down to 10 mg by 10 mg every other day for    a total of 10 days of therapy. 5. Tussionex 5 cc q.12h. p.r.n.  HOSPITAL COURSE:  Patient was admitted on 01/12/02 complaining of a four to five day history of coughing spells without any sputum production.  She had a chest x-ray performed on the day of admission which showed an interstitial infiltrate pattern.  The patient had just returned from [place] and was admitted for observation and therapy.  Patient was treated initially with intravenous Decadron, then prednisone 40 mg daily as well as nebulized albuterol treatments.  Initially, doxycycline was used as an antibiotic and, then, this was switched to Tequin on January 13, 2002, 400 mg daily.  Patient slowly improved over the next several days with excellent oxygenation with O2 saturations in the 97 to 98% range on room air.  By January 15, 2002, lungs were clear though she still had a dry cough.  Patient was discharged on the above medications to follow up in one two two weeks at [facility].  Prescriptions for Tequin and prednisone were called in to the [facility] pharmacy at [facility] and a prescription of Tussionex is available for the patient to take by the pharmacy to fill if  necessary.  DISCHARGE LABORATORIES:  E. coli urinary tract infection pansensitive.  It was sensitive to ciprofloxacin and, therefore, should be sensitive to Tequin.  She had greater than 100,000 colonies on 01/12/02.  Laboratories from 01/13/02 revealed a white count of 6700, hemoglobin 12.0, platelet count 176,000. Sodium on 01/13/02 was 141, potassium slightly low at 3.3, and this was replaced with several doses of oral potassium at 20 mEq.  Chloride was 112, bicarbonate 24, glucose 142.  Patient was on prednisone therapy.  BUN 5, creatinine 0.9, calcium 8.1.  Urinalysis from 01/12/02 revealed 11 to 20 white cells, many bacteria, positive nitrite, and moderate leukocyte esterase.  It otherwise was negative.  Patient was discharged home in improved condition and, again, she will follow up with [facility] in one week. Dictated by:   Pearla Dubonnet, M.D. Attending Physician:  Hinda Glatter DD:  01/15/02 TD:  01/17/02 Job: [id number]

## 2010-12-20 NOTE — Op Note (Signed)
[facility]  Patient:    Andrea [name], Andrea [name] Visit Number: [id number] MRN: [id number]          Service Type: DSU Location: 9300 [phone] Attending Physician:  Sharon Mt Dictated by:   Rande Brunt. Eda Paschal, M.D. Proc. Date: 08/03/01 Admit Date:  08/03/2001                             Operative Report  PREOPERATIVE DIAGNOSES:       Uterine prolapse, cystocele, rectocele, small fibroids.  POSTOPERATIVE DIAGNOSES:      Uterine prolapse, cystocele, rectocele, small fibroids.  NAME OF OPERATION:            Vaginal hysterectomy, bilateral salpingo-oophorectomy, posterior repair.  SURGEON:                      Daniel L. Eda Paschal, M.D.  FIRST ASSISTANT:              Katy Fitch, M.D.  FINDINGS:                     Patients uterus was enlarged with multiple myomas.  They were actually larger than they felt on pelvic examination in the office.  There was one really obscuring the cul-de-sac making it difficult to enter.  There was also some endometriosis on the posterior aspect of the serosa of the uterus.  Ovaries and fallopian tubes were normal.  Prior to removing the uterus it appeared that the patient had bad anterior wall support but once the uterus was removed with the myomas, patient actually had an excellent anterior wall with a good urethrovesical angle and absolutely no cystocele, just a small first degree rectocele.  PROCEDURE:                    After adequate general endotracheal anesthesia the patient was placed in the dorsal lithotomy position, prepped and draped in the usual sterile manner.  A 1:200,000 solution of epinephrine and 0.5% Xylocaine was injected around the cervix.  A 360 degree incision was made around the cervix.  The bladder was mobilized superiorly as was the posterior peritoneum.  Initially, it was impossible to get in the cul-de-sac and it was found as the surgery proceeded that the reason for that was  that the cul-de-sac was completely obliterated by a myoma which was adherent to the cul-de-sac.  It was literally adherent to it.  Therefore, after a 360 degree incision made around the cervix as described above and everything was dissected anteriorly, the vesicouterine fold and peritoneum could be entered without difficulty.  An attempt was made to try to deliver the uterus anteriorly to start the hysterectomy from the top but because myomas that were larger that were felt to be encountered, this was not possible.  Initially, the uterosacral and cardinal ligaments were clamped without actually being in the peritoneum posteriorly.  They were sutured with #1 chromic catgut.  The uterosacral ligaments were shortened so that they could be sutured laterally into the vault which they were later for good vault support.  At this point now the myoma posteriorly could be seen bulging.  The adhesions adhering into the cul-de-sac were dissected free and the cul-de-sac could be entered.  The surgeon put a finger in the rectum and the rectum had not been injured in any fashion.  The uterine arteries were clamped, cut, and doubly suture  ligated with #1 chromic catgut.  Multiple myomectomies had to be done in order to deliver the uterus and these were done without difficulty.  The adnexa were then identified.  The infundibulopelvic ligaments were clamped, cut, and doubly suture ligated with #1 chromic catgut.  The uterus, myomas, ovaries, and tubes were sent to pathology in pieces for tissue diagnosis.  The vaginal cuff was stitched to the posterior peritoneum with a running locking 0 Vicryl.  Copious irrigation was done with Ringers lactate.  Two sponge, needle, instrument counts were correct.  The cul-de-sac was closed with a modified pulse enterocele suture of 2-0 Vicryl.  The vaginal cuff and the peritoneum were closed with figure-of-eight of #1 chromic catgut.  At this point the anterior wall was  reassessed.  What really had been change in the appearance of it was the prolapsed uterus with the myomas and therefore without the above patient did not have a cystocele and did have a good urethrovesical angle and it made sense with what Dr. Etta Grandchild said preoperatively about not wanting to do any bladder repair.  A Foley catheter was inserted.  Clear urine drained from it and then attention was turned to the posterior repair.  The posterior vaginal cuff was undermined from the introitus all the way to the top of the cuff.  The perirectal fascia was sharply dissected free and then the rectocele was reduced with approximately seven sutures of 2-0 Vicryl using good perirectal fascia.  Redundant vaginal mucosa was trimmed away and then the posterior mucosa was then closed with a running locking 2-0 Vicryl picking up fascia beneath it to eliminate dead space.  The vagina was packed with a sponge.  Two sponge, needle, and instrument counts were correct.  The patient tolerated her procedure well and left the operating room in satisfactory condition, draining clear urine from her Foley catheter. Dictated by:   Rande Brunt. Eda Paschal, M.D. Attending Physician:  Sharon Mt DD:  08/03/01 TD:  08/03/01 Job: [id number]

## 2010-12-20 NOTE — H&P (Signed)
[facility]  Patient:    Andrea [name], Andrea [name] Visit Number: [id number] MRN: [id number]          Service Type: DSU Location: [phone] Attending Physician:  Sharon Mt Dictated by:   Rande Brunt. Eda Paschal, M.D. Admit Date:  08/03/2001                           History and Physical  CHIEF COMPLAINT:              Symptomatic uterine prolapse.  HISTORY OF PRESENT ILLNESS:   The patient is a 60 year old, gravida 2, para 1, AB 1, who came to the office with a history of one year of significant uterine prolapse.  She is aware of a large bulge.  She gets a dragging sensation because it is so low and she has a lot of trouble coping with the above.  It has made intercourse almost impossible.  She also has loss of urine, which appears to be mostly with coughing, laughing, and sneezing.  She is status post a Marshall-Marchetti procedure done 14 years ago and for 10 years afterwards had a good result, but now she is having some trouble with incontinence.  On physical examination, the patient has third degree uterine descensus.  Because of the uterine descensus, it appears that she has no anterior wall support.  She was sent to Claudette Laws, M.D., preoperatively because of the above.  When he saw her, he was concerned that surgical correction of her incontinence would be unsuccessful, he was worried about a neurogenic bladder, and he felt that possibly all of her symptomatology was related to her uterus.  He suggested that I go ahead and do the gynecological surgery, which is a vaginal hysterectomy and anterior and posterior repair and that he would then reevaluate her afterwards and only do bladder surgery if it was appropriate.  The patient is now postmenopausal.  We discussed the pros and cons of removing her ovaries.  She would like to have her ovaries removed as well.  We will not remove them, however, if they cannot be removed vaginally if  she does not want to have an incision for that alone.  PAST MEDICAL HISTORY:         History of asthma.  She has also had a DVT when she was on oral contraceptives many years ago, which became a pulmonary embolus and she was treated with anticoagulants.  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  PAST SURGICAL HISTORY:        Appendectomy and Marshall-Marchetti.  PRESENT MEDICATIONS:          Advair, albuterol, and hydrochlorothiazide.  ALLERGIES:                    She is allergic to VIOXX.  FAMILY HISTORY:               She has an aunt, sister, and grandmother who are diabetic.  Her mother and maternal grandmother are hypertensive.  Her mother and maternal grandmother have heart disease.  SOCIAL HISTORY:               She drinks alcohol rarely.  She does not smoke.  REVIEW OF SYSTEMS:            HEENT:  Negative.  Cardiac:  Negative, except for a history of DVT and pulmonary embolus.  See  above.  Respiratory:  Asthma. GI:  Negative.  GU:  See above.  Neuromuscular:  Negative.  Endocrine: Negative.  PHYSICAL EXAMINATION:         The patient is a well-developed, well-nourished female in no acute distress.  VITAL SIGNS:                  Her blood pressure is 136/84, pulse 80 and regular, respirations 16 and nonlabored, and she is afebrile.  HEENT:                        Within normal limits.  NECK:                         Supple.  Trachea in the midline.  The thyroid is not enlarged.  LUNGS:                        Clear to P&A.  HEART:                        No thrills, heaves, or murmurs.  BREASTS:                      No masses.  ABDOMEN:                      Soft without groin rebound or masses.  PELVIC:                       External is within normal limits.  BUS is within normal limits.  The vaginal examination shows no anterior wall support, but with third degree uterine descensus it is very difficult to evaluate.  The cervix is clean.  The uterus  is enlarged by small myomas to about 7-8 weeks size.  There is third degree uterine descensus.  The adnexa are palpably normal.  RECTAL:                       Negative.  EXTREMITIES:                  Within normal limits.  ADMISSION IMPRESSION:         1. Symptomatic uterine prolapse.                               2. Cystocele.                               3. Rectocele.                               4. Incontinence of unsure etiology.                               5. Small fibroids.  PLAN:                         1. Vaginal hysterectomy.                               2. Anterior  and posterior repair.                               3. Bilateral salpingo-oophorectomy. Dictated by:   Rande Brunt. Eda Paschal, M.D. Attending Physician:  Sharon Mt DD:  08/03/01 TD:  08/03/01 Job: [id number]

## 2010-12-20 NOTE — Discharge Summary (Signed)
[facility]  Patient:    Andrea [name], Andrea [name] Visit Number: [id number] MRN: [id number]          Service Type: GYN Location: [phone] Attending Physician:  Sharon Mt Dictated by:   Rande Brunt. Eda Paschal, M.D. Admit Date:  08/03/2001 Discharge Date: 08/06/2001                             Discharge Summary  HISTORY:                      The patient is a 60 year old female who was admitted to the hospital with symptomatic uterine prolapse for definitive surgery.  HOSPITAL COURSE:              On the day of admission she was taken to the operating room, a vaginal hysterectomy, bilateral salpingo-oophorectomy and posterior repair were performed.  Postoperatively she had an ileus which responded to IV fluids and restriction of diet and by the third postoperative day she was voiding well and passing stool. She was discharged on Darvocet N 100 for pain relief.  She had had previous DVTs on two occasions so she was treated with Lovenox perioperatively and postoperatively and will continue Lovenox 40 mg subcutaneous daily for 14 days.  She will return to my office in four weeks.  DISCHARGE DIET:               Regular.  DISCHARGE ACTIVITY:           Ambulatory.  DISCHARGE DIAGNOSES:          1. Symptomatic uterine prolapse.                               2. Rectocele.  OPERATIONS:                   1. Vaginal hysterectomy.                               2. Bilateral salpingo-oophorectomy.                               3. Posterior repair. Dictated by:   Rande Brunt. Eda Paschal, M.D. Attending Physician:  Sharon Mt DD:  08/06/01 TD:  08/06/01 Job: [id number]

## 2010-12-20 NOTE — Discharge Summary (Signed)
Andrea [name], GRIFFETH            ACCOUNT NO.:  [id number]   MEDICAL RECORD NO.:  [id number]          PATIENT TYPE:  INP   LOCATION:  6730                         FACILITY:  [facility]   PHYSICIAN:  Jackie Plum, M.D.DATE OF BIRTH:  Apr 11, 1951   DATE OF ADMISSION:  06/15/2005  DATE OF DISCHARGE:  06/23/2005                                 DISCHARGE SUMMARY   DISCHARGE DIAGNOSIS:  Asthmatic bronchitis, improved.   DISCHARGE LABORATORIES:  WBC count 10.4, hemoglobin 12.8, hematocrit 37.3,  platelet count 287,000, MCV 87.4. Sodium 159, potassium 4.1, chloride 105,  CO2 27, glucose 101, BUN 14, creatinine 1.0, calcium 8.3.   DISCHARGE MEDICATIONS:  The patient is to resume all her preadmission  medications. Please see admission H&P dictated on June 14, 2005.  New  medications were Protonix 40 mg daily and prednisone taper. No antibiotics  were prescribed. The patient has completed more than 7-day course antibiotic  treatment and there was no evidence of pneumonia process on the x-rays.   CONSULTATIONS:  Not applicable.   PROCEDURE:  Not applicable.   CONDITION ON DISCHARGE:  Improved and satisfactory.   REASON FOR ADMISSION:  Dyspnea with asthmatic bronchitis.   The patient presented with a 10-day history of progressively worsening  dyspnea with cough productive of yellowish sputum as well as low-grade fever  with chills. She had not had any chest pain, nausea, or vomiting. In the  emergency room the patient was evaluated by the emergency department  physician where upon x-ray revealed no acute infiltrate. The patient was,  however, to be requiring higher flow oxygen and was therefore treated for  further treatment. On admission, the patient's pulse rate was 106 per minute  and her sats 90% on four liters of oxygen per nasal cannula. She was in mild  respiratory distress and her pulmonary auscultation had bilateral diffuse  wheezes without crackles. She was mildly tachycardia  without any gallops.  She did not have any edema of extremities.   HOSPITAL COURSE:  The patient was started on nebulization, bronchodilators  as well as Atrovent, steroids, and antibiotics. 12-lead EKG revealed no  acute changes. She had a set of cardiac enzymes which were all negative. She  was therefore continued on pulmonary toileting and the patient was slow to  improve. Over the last 48 hours the patient has consistently shown  improvement and her O2 saturation over the last 24 hours has been 96% to 98%  on room air with activity. On round today the patient was feeling better and  she was ready for discharge. She was hemodynamically stable.  Denies fever  or chills, nausea, vomiting, chest pain, and dyspnea.   Her discharge vital signs reveal blood pressure of 135/82, pulse 98,  respirations 20, temperature 97.5 degrees Fahrenheit. The patient is alert  and oriented times three, no acute __________. Oropharynx is moist. Neck  supple without JVD. Lungs revealed occasional rhonchi without any wheezes in  the bases. Abdomen was soft, nontender. Extremities with no cyanosis.  Cardiac __________, no gallops or murmurs.   The patient is going to be discharged with outpatient follow-up with her  PCP  in 10-14 days. She is stable and satisfactory condition.      Jackie Plum, M.D.  Electronically Signed     GO/MEDQ  D:  06/23/2005  T:  06/23/2005  Job:  [id number]   cc:   Pam Drown, M.D.  Fax: [phone]

## 2010-12-20 NOTE — Assessment & Plan Note (Signed)
[facility]                             PULMONARY OFFICE NOTE   LEONARD, HENDLER                   MRN:          [id number]  DATE:12/02/2006                            DOB:          [birth year]    PROBLEM LIST:  1. Asthma.  2. Abnormal diffusion capacity/previous pulmonary embolism.  3. Post phlebitic syndrome.  4. Allergic rhinitis.  5. Osteoporosis.   HISTORY OF PRESENT ILLNESS:  She returns for scheduled followup feeling  stable after increased congestion early in the pollen season.  She used  her nebulizer after a flu-like illness this winter, but today is good.  Her peak flow at best is around 450.  She very much likes Spiriva which  has made a real difference with her.  She is doing some volunteer with  the marathon in [place] this weekend.   MEDICATIONS:  1. Allegra 180 mg.  2. Singulair 10 mg.  3. Fluticasone nasal spray.  4. Simvastatin 80 mg.  5. Fosamax.  6. Spiriva.  7. Foradil.  8. Asthmanex one b.i.d.  9. Vitamin D.  10.Xopenex HFA inhaler two puffs four times a day p.r.n.  She has a      home nebulizer with DuoNeb which she has not needed.  11.Question drug intolerance to Avelox.   OBJECTIVE:  VITAL SIGNS:  Weight 182 pounds, BP 118/62, pulse regular  and 81, room air saturation 99%.  GENERAL:  She is in good spirits.  LUNGS:  Breathing is unlabored.  Chest is quiet and clear.  HEART:  Regular without murmur.  EXTREMITIES:  There is not obvious ankle edema.   IMPRESSION:  1. Allergic rhinitis, better now after flaring at the beginning of the      tree pollen season.  2. Asthmatic bronchitis under good control with Spiriva.  3. Post phlebitis syndrome.   PLAN:  1. We refilled her Xopenex HFA with discussion.  2. Fexofenadine 180 mg p.r.n.  Medication talk done.  3. Schedule return in 1 year, earlier p.r.n.     Clinton D. Maple Hudson, MD, Tonny Bollman, FACP  Electronically Signed    CDY/MedQ  DD: 12/02/2006  DT:  12/03/2006  Job #: [id number]   cc:   Pam Drown, M.D.

## 2011-04-11 ENCOUNTER — Encounter: Payer: Self-pay | Admitting: Internal Medicine

## 2011-04-11 ENCOUNTER — Other Ambulatory Visit: Payer: 59

## 2011-04-11 ENCOUNTER — Ambulatory Visit (INDEPENDENT_AMBULATORY_CARE_PROVIDER_SITE_OTHER): Payer: 59 | Admitting: Internal Medicine

## 2011-04-11 VITALS — BP 126/84 | HR 84 | Ht 63.5 in | Wt 179.0 lb

## 2011-04-11 DIAGNOSIS — Z23 Encounter for immunization: Secondary | ICD-10-CM

## 2011-04-11 DIAGNOSIS — J45909 Unspecified asthma, uncomplicated: Secondary | ICD-10-CM

## 2011-04-11 DIAGNOSIS — I2699 Other pulmonary embolism without acute cor pulmonale: Secondary | ICD-10-CM

## 2011-04-11 NOTE — Patient Instructions (Signed)
Move slowly, with low doses initially - for any necessary exposure to NSAID type pain relievers- aspirin, ibuprofen etc.  Order- lab- Allergen, strawberry  [phone]         Allergen acetylsalicylic acid- 10272  FMLA papers  Flu vax

## 2011-04-11 NOTE — Assessment & Plan Note (Addendum)
IgE levels don't seem consistent with a significant allergic component. FMLA today Flu vax today Labs for allergy to aspirin and strawberry. Will advise to proceed slowly with any trial exposure to NSAIDS

## 2011-04-11 NOTE — Progress Notes (Signed)
Subjective:    Patient ID: Andrea [name], female    DOB: 01-13-1951, 60 y.o.   MRN: [id number]  HPI    Review of Systems     Objective:   Physical Exam        Assessment & Plan:   Subjective:    Patient ID: Andrea [name], female    DOB: November 04, 1950, 60 y.o.   MRN: [id number]  HPI 59yoF never smoker here for f/u of allergic rhinitis and asthma, with hx DVT/PE. Last here April 22, 2010. She is trying not to go outside much in this pollen season. Still works at home, which has reduced her sick days and ER trips (none since 2010). Uses rescue inhaler usually about 3-4 x/ week. In last week she is using it or the nebulizer 2-3x/ day. She is using Foradil and Asmanex as cheaper than Advair.. Last prednisone was with a cold in February. She feels her status now is typical for this time of year. She has never used allergy vaccine or Xolair. Last hosp 2006.  IgE level 05/08/2009 was 25.4 with no elevations on allergy profile. Wakes some nights, tight, needing neb or rescue inhaler.   04/11/11- 60yoF never smoker here for f/u of allergic rhinitis and asthma, with hx DVT/PE. Going to PT for low back pain and she asks about tolerance to anti-inflammatories. In past she reacted to Vioxx with asthma w/in 1 hour of single dose. In 2010 shortly after eating a lot of strawberries and having taken motrin and aleve over a couple of weeks for dental work.  She got diffuse flush, chest tight, cough> ER. In summer, 2011 had increased cough and wheeze. She suspects maybe strawberries from farmer's market. This year she avoided the strawberries. We discussed unreliability of our assays.  Has been using rescue inhaler once before exercising at gym. Exposure to perfume tightened her.   Review of Systems Constitutional:   No weight loss, night sweats,  Fevers, chills, fatigue, lassitude. HEENT:   No headaches,  Difficulty swallowing,  Tooth/dental problems,  Sore throat,                No sneezing,  itching, ear ache, nasal congestion, post nasal drip,  CV:  No chest pain,  Orthopnea, PND, swelling in lower extremities, anasarca, dizziness, palpitations GI  No heartburn, indigestion, abdominal pain, nausea, vomiting, diarrhea, change in bowel habits, loss of appetite Resp: No shortness of breath at rest.  No excess mucus, no productive cough, Yes- Non-productive cough,  No coughing up of blood.  No change in color of mucus.  No recent wheezing.   Skin: no rash or lesions. GU: no dysuria, change in color of urine, no urgency or frequency.  No flank pain. MS:  No joint pain or swelling.  No decreased range of motion.  No back pain. Psych:  No change in mood or affect. No depression or anxiety.  No memory loss.     Objective:   Physical Exam General- Alert, Oriented, Affect-appropriate, Distress- none acute  overweight Skin- rash-none, lesions- none, excoriation- none; fair complexion Lymphadenopathy- none Head- atraumatic            Eyes- Gross vision intact, PERRLA, conjunctivae clear secretions            Ears- Hearing, canals normal            Nose- Clear, No-Septal dev, mucus, polyps, erosion, perforation             Throat- Mallampati  III , mucosa clear , drainage- none, tonsils- atrophic Neck- flexible , trachea midline, no stridor , thyroid nl, carotid no bruit Chest - symmetrical excursion , unlabored           Heart/CV- RRR , no murmur , no gallop  , no rub, nl s1 s2                           - JVD- none , edema- none, stasis changes- none, varices- none           Lung- clear to P&A, wheeze- none, cough- none , dullness-none, rub- none           Chest wall-  Abd- tender-no, distended-no, bowel sounds-present, HSM- no Br/ Gen/ Rectal- Not done, not indicated Extrem- cyanosis- none, clubbing, none, atrophy- none, strength- nl Neuro- grossly intact to observation         Assessment & Plan:

## 2011-04-14 ENCOUNTER — Encounter: Payer: Self-pay | Admitting: Internal Medicine

## 2011-04-25 LAB — BASIC METABOLIC PANEL
BUN: 12
BUN: 18
CO2: 26
Calcium: 8 — ABNORMAL LOW
Calcium: 8 — ABNORMAL LOW
Calcium: 8.8
Chloride: 102
Chloride: 103
Chloride: 106
Creatinine, Ser: 1.05
Creatinine, Ser: 1.15
GFR calc Af Amer: 59 — ABNORMAL LOW
GFR calc Af Amer: >60
GFR calc Af Amer: >60
GFR calc Af Amer: >60
GFR calc Af Amer: >60
GFR calc non Af Amer: 54 — ABNORMAL LOW
GFR calc non Af Amer: 56 — ABNORMAL LOW
GFR calc non Af Amer: 59 — ABNORMAL LOW
GFR calc non Af Amer: >60
GFR calc non Af Amer: >60
Glucose, Bld: 118 — ABNORMAL HIGH
Potassium: 4.2
Potassium: 4.2
Potassium: 4.6
Sodium: 138
Sodium: 138
Sodium: 139
Sodium: 140
Sodium: 142

## 2011-04-25 LAB — CBC
MCV: 88.3
Platelets: 156
Platelets: 173
RDW: 14.2
WBC: 5.6

## 2011-04-25 LAB — COMPREHENSIVE METABOLIC PANEL
ALT: 25
Alkaline Phosphatase: 55
BUN: 18
CO2: 27
Chloride: 98
GFR calc non Af Amer: 53 — ABNORMAL LOW
Glucose, Bld: 126 — ABNORMAL HIGH
Potassium: 4.3
Sodium: 134 — ABNORMAL LOW
Total Bilirubin: 1.8 — ABNORMAL HIGH

## 2011-04-25 LAB — DIFFERENTIAL
Basophils Absolute: 0
Lymphocytes Relative: 5 — ABNORMAL LOW
Neutro Abs: 6.2

## 2011-04-25 LAB — CARDIAC PANEL(CRET KIN+CKTOT+MB+TROPI)
CK, MB: 1.9
Relative Index: 1.1
Troponin I: 0.01

## 2011-04-25 LAB — I-STAT 8, (EC8 V) (CONVERTED LAB)
Acid-Base Excess: 1
Chloride: 101
Glucose, Bld: 135 — ABNORMAL HIGH
Potassium: 4
pCO2, Ven: 45.5
pH, Ven: 7.378 — ABNORMAL HIGH

## 2011-04-25 LAB — TROPONIN I: Troponin I: <0.01

## 2011-04-25 LAB — BLOOD GAS, ARTERIAL
FIO2: 0.21
pCO2 arterial: 37
pO2, Arterial: 61.8 — ABNORMAL LOW

## 2011-04-25 LAB — POCT CARDIAC MARKERS
CKMB, poc: <1 — ABNORMAL LOW
Myoglobin, poc: 99.9
Operator id: 196461

## 2011-04-25 LAB — POCT I-STAT CREATININE: Operator id: 196461

## 2011-04-29 ENCOUNTER — Ambulatory Visit: Payer: Self-pay | Admitting: Internal Medicine

## 2011-04-30 ENCOUNTER — Ambulatory Visit: Payer: 59 | Attending: Family Medicine | Admitting: Physical Therapy

## 2011-04-30 DIAGNOSIS — M545 Low back pain, unspecified: Secondary | ICD-10-CM | POA: Insufficient documentation

## 2011-04-30 DIAGNOSIS — IMO0001 Reserved for inherently not codable concepts without codable children: Secondary | ICD-10-CM | POA: Insufficient documentation

## 2011-04-30 DIAGNOSIS — M2560 Stiffness of unspecified joint, not elsewhere classified: Secondary | ICD-10-CM | POA: Insufficient documentation

## 2011-05-06 ENCOUNTER — Ambulatory Visit: Payer: 59 | Attending: Family Medicine | Admitting: Physical Therapy

## 2011-05-06 DIAGNOSIS — IMO0001 Reserved for inherently not codable concepts without codable children: Secondary | ICD-10-CM | POA: Insufficient documentation

## 2011-05-06 DIAGNOSIS — M545 Low back pain, unspecified: Secondary | ICD-10-CM | POA: Insufficient documentation

## 2011-05-06 DIAGNOSIS — M2560 Stiffness of unspecified joint, not elsewhere classified: Secondary | ICD-10-CM | POA: Insufficient documentation

## 2011-05-09 ENCOUNTER — Ambulatory Visit: Payer: Self-pay | Admitting: Internal Medicine

## 2011-05-14 ENCOUNTER — Ambulatory Visit: Payer: 59 | Admitting: Physical Therapy

## 2011-05-20 ENCOUNTER — Ambulatory Visit: Payer: 59 | Admitting: Physical Therapy

## 2011-06-13 ENCOUNTER — Telehealth: Payer: Self-pay | Admitting: Internal Medicine

## 2011-06-13 MED ORDER — ALBUTEROL SULFATE HFA 108 (90 BASE) MCG/ACT IN AERS: 2.0000 | INHALATION_SPRAY | Freq: Four times a day (QID) | RESPIRATORY_TRACT | Status: DC

## 2011-06-13 MED ORDER — TIOTROPIUM BROMIDE MONOHYDRATE 18 MCG IN CAPS: 18.0000 ug | ORAL_CAPSULE | Freq: Every day | RESPIRATORY_TRACT | Status: DC

## 2011-06-13 MED ORDER — MONTELUKAST SODIUM 10 MG PO TABS: 10.0000 mg | ORAL_TABLET | Freq: Every day | ORAL | Status: DC

## 2011-06-13 NOTE — Telephone Encounter (Signed)
Refills sent for Spiriva, Ventolin and Singulair as requested. LMOMTCB.  Need to  verify that this is all the pt needed sent.

## 2011-06-16 NOTE — Telephone Encounter (Signed)
lmomtcb x 2  

## 2011-06-17 MED ORDER — FLUTICASONE-SALMETEROL 500-50 MCG/DOSE IN AEPB: 1.0000 | INHALATION_SPRAY | Freq: Two times a day (BID) | RESPIRATORY_TRACT | Status: DC

## 2011-06-17 MED ORDER — PREDNISONE 20 MG PO TABS: ORAL_TABLET | ORAL | Status: DC

## 2011-06-17 MED ORDER — FLUTICASONE PROPIONATE 50 MCG/ACT NA SUSP: 2.0000 | Freq: Every day | NASAL | Status: DC | PRN

## 2011-06-17 MED ORDER — ALBUTEROL SULFATE 0.63 MG/3ML IN NEBU: 1.0000 | INHALATION_SOLUTION | Freq: Four times a day (QID) | RESPIRATORY_TRACT | Status: DC | PRN

## 2011-06-17 NOTE — Telephone Encounter (Signed)
I spoke with pt and she states she also needed her advair 500-50, prednisone, albuterol nebulizer med, and flonase sent to pharmacy. i advised pt will send rx for her. Per Florentina Addison okay to refill her prednisone bc she keeps it on hand for PRN. Rx's has been sent

## 2011-08-08 ENCOUNTER — Encounter: Payer: Self-pay | Admitting: Internal Medicine

## 2011-10-10 ENCOUNTER — Ambulatory Visit (INDEPENDENT_AMBULATORY_CARE_PROVIDER_SITE_OTHER): Payer: 59 | Admitting: Internal Medicine

## 2011-10-10 ENCOUNTER — Encounter: Payer: Self-pay | Admitting: Internal Medicine

## 2011-10-10 VITALS — BP 150/104 | HR 99 | Ht 63.5 in | Wt 185.4 lb

## 2011-10-10 DIAGNOSIS — J45909 Unspecified asthma, uncomplicated: Secondary | ICD-10-CM

## 2011-10-10 DIAGNOSIS — J309 Allergic rhinitis, unspecified: Secondary | ICD-10-CM

## 2011-10-10 MED ORDER — PREDNISONE 20 MG PO TABS: ORAL_TABLET | ORAL | Status: DC

## 2011-10-10 NOTE — Progress Notes (Signed)
Patient ID: Andrea [name], female    DOB: September 03, 1950, 61 y.o.   MRN: [id number]  HPI 59yoF never smoker here for f/u of allergic rhinitis and asthma, with hx DVT/PE. Last here April 22, 2010. She is trying not to go outside much in this pollen season. Still works at home, which has reduced her sick days and ER trips (none since 2010). Uses rescue inhaler usually about 3-4 x/ week. In last week she is using it or the nebulizer 2-3x/ day. She is using Foradil and Asmanex as cheaper than Advair.. Last prednisone was with a cold in February. She feels her status now is typical for this time of year. She has never used allergy vaccine or Xolair. Last hosp 2006.  IgE level 05/08/2009 was 25.4 with no elevations on allergy profile. Wakes some nights, tight, needing neb or rescue inhaler.   04/11/11- 60yoF never smoker here for f/u of allergic rhinitis and asthma, with hx DVT/PE. Going to PT for low back pain and she asks about tolerance to anti-inflammatories. In past she reacted to Vioxx with asthma w/in 1 hour of single dose. In 2010 shortly after eating a lot of strawberries and having taken motrin and aleve over a couple of weeks for dental work , she got diffuse flush, chest tight, cough> ER. In summer, 2011 had increased cough and wheeze. She suspects maybe strawberries from farmer's market. This year she avoided the strawberries. We discussed unreliability of our assays.  Has been using rescue inhaler once before exercising at gym. Exposure to perfume tightened her.   10/10/11-  60yoF never smoker here for f/u of allergic rhinitis and asthma/ NSAIDs, with hx DVT/PE. Had a viral respiratory infection around [place], finally resolving. Took prednisone because her peak flows were low and is now back to her normal peak flow of 450-500. Took no antibiotics. No longer taking prednisone. Has a history of wheezing with nonsteroidal anti-inflammatory drugs. Spring rhinitis has started, began Flonase.  Denies GERD.  Review of Systems-see HPI Constitutional:   No weight loss, night sweats,  Fevers, chills, fatigue, lassitude. HEENT:   No headaches,  Difficulty swallowing,  Tooth/dental problems,  Sore throat,                + sneezing, itching, ear ache, nasal congestion, post nasal drip,  CV:  No chest pain,  Orthopnea, PND, swelling in lower extremities, anasarca, dizziness, palpitations GI  No heartburn, indigestion, abdominal pain, nausea, vomiting,  Resp: No shortness of breath at rest.  No excess mucus, no productive cough, +- Non-productive cough,  No coughing up of blood.  No change in color of mucus.  No recent wheezing.   Skin: no rash or lesions. GU: no dysuria, . MS:  No joint pain . Psych:  No change in mood or affect. No depression or anxiety.  No memory loss.     Objective:   Physical Exam General- Alert, Oriented, Affect-appropriate, Distress- none acute,  overweight Skin- rash-none, lesions- none, excoriation- none; fair complexion Lymphadenopathy- none Head- atraumatic            Eyes- Gross vision intact, PERRLA, conjunctivae clear secretions            Ears- Hearing, canals normal            Nose- Clear, No-Septal dev, mucus, polyps, erosion, perforation             Throat- Mallampati III , mucosa clear , drainage- none, tonsils- atrophic Neck-  flexible , trachea midline, no stridor , thyroid nl, carotid no bruit Chest - symmetrical excursion , unlabored           Heart/CV- RRR , no murmur , no gallop  , no rub, nl s1 s2                           - JVD- none , edema- none, stasis changes- none, varices-            Lung- clear to P&A, wheeze- none, cough- none , dullness-none, rub- none           Chest wall-  Abd-   Br/ Gen/ Rectal- Not done, not indicated Extrem- cyanosis- none, clubbing, none, atrophy- none, strength- nl Neuro- grossly intact to observation

## 2011-10-10 NOTE — Patient Instructions (Signed)
Prednisone refill sent to have on hand  Please call as needed  Order- Schedule PFT dx allergic asthma

## 2011-10-14 NOTE — Assessment & Plan Note (Signed)
Used prednisone on her own initiative for chest tightness with the winter cold. Steroid side effects and strategies again reviewed. Plan-refill prednisone taper to have on hand as discussed.

## 2011-10-14 NOTE — Assessment & Plan Note (Signed)
To continue Flonase, adding antihistamines and decongestants if needed.

## 2011-10-30 ENCOUNTER — Ambulatory Visit (INDEPENDENT_AMBULATORY_CARE_PROVIDER_SITE_OTHER): Payer: 59 | Admitting: Internal Medicine

## 2011-10-30 DIAGNOSIS — J45909 Unspecified asthma, uncomplicated: Secondary | ICD-10-CM

## 2011-10-30 LAB — PULMONARY FUNCTION TEST

## 2011-10-30 NOTE — Progress Notes (Signed)
PFT done today. 

## 2011-12-10 ENCOUNTER — Other Ambulatory Visit: Payer: Self-pay | Admitting: Internal Medicine

## 2012-02-27 ENCOUNTER — Other Ambulatory Visit: Payer: Self-pay | Admitting: Internal Medicine

## 2012-04-15 ENCOUNTER — Ambulatory Visit: Payer: 59 | Admitting: Internal Medicine

## 2012-04-16 ENCOUNTER — Ambulatory Visit (INDEPENDENT_AMBULATORY_CARE_PROVIDER_SITE_OTHER): Payer: 59 | Admitting: Internal Medicine

## 2012-04-16 ENCOUNTER — Encounter: Payer: Self-pay | Admitting: Internal Medicine

## 2012-04-16 VITALS — BP 118/62 | HR 88 | Ht 63.5 in | Wt 185.6 lb

## 2012-04-16 DIAGNOSIS — J309 Allergic rhinitis, unspecified: Secondary | ICD-10-CM

## 2012-04-16 DIAGNOSIS — Z23 Encounter for immunization: Secondary | ICD-10-CM

## 2012-04-16 DIAGNOSIS — J45909 Unspecified asthma, uncomplicated: Secondary | ICD-10-CM

## 2012-04-16 MED ORDER — EPINEPHRINE 0.3 MG/0.3ML IJ DEVI: 0.3000 mg | Freq: Once | INTRAMUSCULAR | Status: DC

## 2012-04-16 MED ORDER — AZELASTINE-FLUTICASONE 137-50 MCG/ACT NA SUSP: 2.0000 | Freq: Every day | NASAL | Status: DC

## 2012-04-16 NOTE — Progress Notes (Signed)
Patient ID: SUJIN HECKENDORF, female    DOB: [birth year], 61 y.o.   MRN: [id number]  HPI 61yoF never smoker here for f/u of allergic rhinitis and asthma, with hx DVT/PE. Last here April 22, 2010. She is trying not to go outside much in this pollen season. Still works at home, which has reduced her sick days and ER trips (none since 2010). Uses rescue inhaler usually about 3-4 x/ week. In last week she is using it or the nebulizer 2-3x/ day. She is using Foradil and Asmanex as cheaper than Advair.. Last prednisone was with a cold in February. She feels her status now is typical for this time of year. She has never used allergy vaccine or Xolair. Last hosp 2006.  IgE level 05/08/2009 was 25.4 with no elevations on allergy profile. Wakes some nights, tight, needing neb or rescue inhaler.   04/11/11- 61yoF never smoker here for f/u of allergic rhinitis and asthma, with hx DVT/PE. Going to PT for low back pain and she asks about tolerance to anti-inflammatories. In past she reacted to Vioxx with asthma w/in 1 hour of single dose. In 2010 shortly after eating a lot of strawberries and having taken motrin and aleve over a couple of weeks for dental work , she got diffuse flush, chest tight, cough> ER. In summer, 2011 had increased cough and wheeze. She suspects maybe strawberries from farmer's market. This year she avoided the strawberries. We discussed unreliability of our assays.  Has been using rescue inhaler once before exercising at gym. Exposure to perfume tightened her.   10/10/11-  61yoF never smoker here for f/u of allergic rhinitis and asthma/ NSAIDs, with hx DVT/PE. Had a viral respiratory infection around [place], finally resolving. Took prednisone because her peak flows were low and is now back to her normal peak flow of 450-500. Took no antibiotics. No longer taking prednisone. Has a history of wheezing with nonsteroidal anti-inflammatory drugs. Spring rhinitis has started, began Flonase.  Denies GERD.  04/16/12- 61 yoF never smoker here for f/u of allergic rhinitis and asthma/ NSAIDs, with hx DVT/PE. Occasional flare up but overall has had a great summer. Had the vents in her home cleaned. Mild nasal congestion now with fall season. PFT: 10/30/2011-normal spirometry flows and volumes(FEV1/FVC 0.88) with insignificant response to bronchodilator. Diffusion mildly reduced 76%  Review of Systems-see HPI Constitutional:   No weight loss, night sweats,  Fevers, chills, fatigue, lassitude. HEENT:   No headaches,  Difficulty swallowing,  Tooth/dental problems,  Sore throat,                + sneezing, itching, ear ache, nasal congestion, post nasal drip,  CV:  No chest pain,  Orthopnea, PND, swelling in lower extremities, anasarca, dizziness, palpitations GI  No heartburn, indigestion, abdominal pain, nausea, vomiting,  Resp: No shortness of breath at rest.  No excess mucus, no productive cough, no- Non-productive cough,  No coughing up of blood.  No change in color of mucus.  No recent wheezing.   Skin: no rash or lesions. GU: no dysuria, . MS:  No joint pain . Psych:  No change in mood or affect. No depression or anxiety.  No memory loss.  Objective:   Physical Exam General- Alert, Oriented, Affect-appropriate, Distress- none acute,  overweight Skin- rash-none, lesions- none, excoriation- none; pale complexion Lymphadenopathy- none Head- atraumatic            Eyes- Gross vision intact, PERRLA, conjunctivae clear secretions  Ears- Hearing, canals normal            Nose- Clear, No-Septal dev, mucus, polyps, erosion, perforation             Throat- Mallampati III , mucosa clear , drainage- none, tonsils- atrophic Neck- flexible , trachea midline, no stridor , thyroid nl, carotid no bruit Chest - symmetrical excursion , unlabored           Heart/CV- RRR , no murmur , no gallop  , no rub, nl s1 s2                           - JVD- none , edema- none, stasis changes- none,  varices-            Lung- clear to P&A, wheeze- none, + cough with deep breath , dullness-none, rub- none           Chest wall-  Abd-   Br/ Gen/ Rectal- Not done, not indicated Extrem- cyanosis- none, clubbing, none, atrophy- none, strength- nl Neuro- grossly intact to observation

## 2012-04-16 NOTE — Patient Instructions (Addendum)
Epipen script refill sent  Sample Dymista nasal spray-  1-2 puffs each nostril once daily, every night at bedtime  Flu vax

## 2012-04-20 ENCOUNTER — Other Ambulatory Visit: Payer: Self-pay | Admitting: Internal Medicine

## 2012-04-25 NOTE — Assessment & Plan Note (Signed)
Mild seasonal rhinitis in spring and fall. Plan-sample Dymista

## 2012-04-25 NOTE — Assessment & Plan Note (Signed)
Surprisingly good pulmonary function scores. She is not describing active problems now. She has raised the head of her bed but still wakes at night with cough. Starts prednisone only if peak flows stay down. Her personal best is between 450 and 500.

## 2012-07-19 ENCOUNTER — Telehealth: Payer: Self-pay | Admitting: Internal Medicine

## 2012-07-19 MED ORDER — FLUTICASONE-SALMETEROL 500-50 MCG/DOSE IN AEPB: 1.0000 | INHALATION_SPRAY | Freq: Two times a day (BID) | RESPIRATORY_TRACT | Status: DC

## 2012-07-19 NOTE — Telephone Encounter (Signed)
RX refilled  

## 2012-09-09 ENCOUNTER — Other Ambulatory Visit: Payer: Self-pay | Admitting: Internal Medicine

## 2012-10-15 ENCOUNTER — Encounter: Payer: Self-pay | Admitting: Internal Medicine

## 2012-10-15 ENCOUNTER — Ambulatory Visit (INDEPENDENT_AMBULATORY_CARE_PROVIDER_SITE_OTHER): Payer: 59 | Admitting: Internal Medicine

## 2012-10-15 VITALS — BP 120/90 | HR 112 | Ht 63.5 in | Wt 191.8 lb

## 2012-10-15 DIAGNOSIS — J45909 Unspecified asthma, uncomplicated: Secondary | ICD-10-CM

## 2012-10-15 NOTE — Assessment & Plan Note (Signed)
She is satisfied to continue present meds. We can review options again if needed.

## 2012-10-15 NOTE — Progress Notes (Signed)
Patient ID: Andrea [name], female    DOB: [birth year], 62 y.o.   MRN: [id number]  HPI 59yoF never smoker here for f/u of allergic rhinitis and asthma, with hx DVT/PE. Last here April 22, 2010. She is trying not to go outside much in this pollen season. Still works at home, which has reduced her sick days and ER trips (none since 2010). Uses rescue inhaler usually about 3-4 x/ week. In last week she is using it or the nebulizer 2-3x/ day. She is using Foradil and Asmanex as cheaper than Advair.. Last prednisone was with a cold in February. She feels her status now is typical for this time of year. She has never used allergy vaccine or Xolair. Last hosp 2006.  IgE level 05/08/2009 was 25.4 with no elevations on allergy profile. Wakes some nights, tight, needing neb or rescue inhaler.   04/11/11- 62yoF never smoker here for f/u of allergic rhinitis and asthma, with hx DVT/PE. Going to PT for low back pain and she asks about tolerance to anti-inflammatories. In past she reacted to Vioxx with asthma w/in 1 hour of single dose. In 2010 shortly after eating a lot of strawberries and having taken motrin and aleve over a couple of weeks for dental work , she got diffuse flush, chest tight, cough> ER. In summer, 2011 had increased cough and wheeze. She suspects maybe strawberries from farmer's market. This year she avoided the strawberries. We discussed unreliability of our assays.  Has been using rescue inhaler once before exercising at gym. Exposure to perfume tightened her.   10/10/11-  62yoF never smoker here for f/u of allergic rhinitis and asthma/ NSAIDs, with hx DVT/PE. Had a viral respiratory infection around [place], finally resolving. Took prednisone because her peak flows were low and is now back to her normal peak flow of 450-500. Took no antibiotics. No longer taking prednisone. Has a history of wheezing with nonsteroidal anti-inflammatory drugs. Spring rhinitis has started, began Flonase.  Denies GERD.  04/16/12- 62 yoF never smoker here for f/u of allergic rhinitis and asthma/ NSAIDs, with hx DVT/PE. Occasional flare up but overall has had a great summer. Had the vents in her home cleaned. Mild nasal congestion now with fall season. PFT: 10/30/2011-normal spirometry flows and volumes(FEV1/FVC 0.88) with insignificant response to bronchodilator. Diffusion mildly reduced 76%  10/15/12- 62 yoF never smoker here for f/u of allergic rhinitis and asthma/ NSAIDs, with hx DVT/PE. FOLLOWS FOR: when out side running errands she can tell allergies are high; starts coughing. Increased seasonal rhinitis symptoms and light cough if stays outside long now- tree pollens. Resumed flonase. We discussed dust mask for yard work. She is satisfied.  Continues Advair 500. Discussed cost and alternatives.   Review of Systems-see HPI Constitutional:   No weight loss, night sweats,  Fevers, chills, fatigue, lassitude. HEENT:   No headaches,  Difficulty swallowing,  Tooth/dental problems,  Sore throat,                + sneezing, itching, ear ache, nasal congestion, post nasal drip,  CV:  No chest pain,  Orthopnea, PND, swelling in lower extremities, anasarca, dizziness, palpitations GI  No heartburn, indigestion, abdominal pain, nausea, vomiting,  Resp: No shortness of breath at rest.  No excess mucus, no productive cough, no- Non-productive cough,  No coughing up of blood.  No change in color of mucus.  No recent wheezing.   Skin: no rash or lesions. GU: no dysuria, . MS:  No  joint pain . Psych:  No change in mood or affect. No depression or anxiety.  No memory loss.  Objective:   Physical Exam General- Alert, Oriented, Affect-appropriate, Distress- none acute,  overweight Skin- rash-none, lesions- none, excoriation- none; pale complexion Lymphadenopathy- none Head- atraumatic            Eyes- Gross vision intact, PERRLA, conjunctivae clear secretions            Ears- Hearing, canals normal             Nose- Clear, No-Septal dev, mucus, polyps, erosion, perforation             Throat- Mallampati II-III , mucosa clear , drainage- none, tonsils- atrophic Neck- flexible , trachea midline, no stridor , thyroid nl, carotid no bruit Chest - symmetrical excursion , unlabored           Heart/CV- RRR , no murmur , no gallop  , no rub, nl s1 s2                           - JVD- none , edema- none, stasis changes- none, varices-            Lung- clear to P&A, wheeze- none, no cough , dullness-none, rub- none           Chest wall-  Abd-   Br/ Gen/ Rectal- Not done, not indicated Extrem- cyanosis- none, clubbing, none, atrophy- none, strength- nl Neuro- grossly intact to observation

## 2012-10-15 NOTE — Assessment & Plan Note (Signed)
More rhinorhea and drainage as tree pollens have gone up. She feels resumption of flonase will be adequate for now. Discussed antihistamines.  Pollen mask for yard work

## 2012-10-15 NOTE — Patient Instructions (Addendum)
We can continue the present meds   Please call as need

## 2012-10-17 ENCOUNTER — Other Ambulatory Visit: Payer: Self-pay | Admitting: Internal Medicine

## 2012-11-10 ENCOUNTER — Other Ambulatory Visit: Payer: Self-pay | Admitting: Internal Medicine

## 2013-01-06 ENCOUNTER — Other Ambulatory Visit: Payer: Self-pay | Admitting: Internal Medicine

## 2013-01-07 NOTE — Telephone Encounter (Signed)
Ok to refill 

## 2013-01-07 NOTE — Telephone Encounter (Signed)
Please advise if okay to refill; not on patients medication list. Thanks.  

## 2013-01-07 NOTE — Telephone Encounter (Signed)
Rx has been refilled per CY. 

## 2013-01-10 ENCOUNTER — Other Ambulatory Visit: Payer: Self-pay | Admitting: Internal Medicine

## 2013-02-10 ENCOUNTER — Other Ambulatory Visit: Payer: Self-pay | Admitting: Internal Medicine

## 2013-02-12 ENCOUNTER — Other Ambulatory Visit: Payer: Self-pay | Admitting: Internal Medicine

## 2013-05-13 ENCOUNTER — Other Ambulatory Visit: Payer: Self-pay | Admitting: Internal Medicine

## 2013-06-07 ENCOUNTER — Encounter: Payer: Self-pay | Admitting: Internal Medicine

## 2013-06-07 ENCOUNTER — Ambulatory Visit (INDEPENDENT_AMBULATORY_CARE_PROVIDER_SITE_OTHER): Payer: 59 | Admitting: Internal Medicine

## 2013-06-07 VITALS — BP 118/72 | HR 82 | Ht 63.5 in | Wt 184.2 lb

## 2013-06-07 DIAGNOSIS — Z23 Encounter for immunization: Secondary | ICD-10-CM

## 2013-06-07 DIAGNOSIS — J069 Acute upper respiratory infection, unspecified: Secondary | ICD-10-CM

## 2013-06-07 NOTE — Progress Notes (Signed)
Patient ID: Andrea [name], female    DOB: 07-17-1951, 62 y.o.   MRN: [id number]  HPI 62yoF never smoker here for f/u of allergic rhinitis and asthma, with hx DVT/PE. Last here April 22, 2010. She is trying not to go outside much in this pollen season. Still works at home, which has reduced her sick days and ER trips (none since 2010). Uses rescue inhaler usually about 3-4 x/ week. In last week she is using it or the nebulizer 2-3x/ day. She is using Foradil and Asmanex as cheaper than Advair.. Last prednisone was with a cold in February. She feels her status now is typical for this time of year. She has never used allergy vaccine or Xolair. Last hosp 2006.  IgE level 05/08/2009 was 25.4 with no elevations on allergy profile. Wakes some nights, tight, needing neb or rescue inhaler.   04/11/11- 62yoF never smoker here for f/u of allergic rhinitis and asthma, with hx DVT/PE. Going to PT for low back pain and she asks about tolerance to anti-inflammatories. In past she reacted to Vioxx with asthma w/in 1 hour of single dose. In 2010 shortly after eating a lot of strawberries and having taken motrin and aleve over a couple of weeks for dental work , she got diffuse flush, chest tight, cough> ER. In summer, 2011 had increased cough and wheeze. She suspects maybe strawberries from farmer's market. This year she avoided the strawberries. We discussed unreliability of our assays.  Has been using rescue inhaler once before exercising at gym. Exposure to perfume tightened her.   10/10/11-  62yoF never smoker here for f/u of allergic rhinitis and asthma/ NSAIDs, with hx DVT/PE. Had a viral respiratory infection around [place], finally resolving. Took prednisone because her peak flows were low and is now back to her normal peak flow of 450-500. Took no antibiotics. No longer taking prednisone. Has a history of wheezing with nonsteroidal anti-inflammatory drugs. Spring rhinitis has started, began Flonase.  Denies GERD.  04/16/12- 62 yoF never smoker here for f/u of allergic rhinitis and asthma/ NSAIDs, with hx DVT/PE. Occasional flare up but overall has had a great summer. Had the vents in her home cleaned. Mild nasal congestion now with fall season. PFT: 10/30/2011-normal spirometry flows and volumes(FEV1/FVC 0.88) with insignificant response to bronchodilator. Diffusion mildly reduced 76%  10/15/12- 62 yoF never smoker here for f/u of allergic rhinitis and asthma/ NSAIDs, with hx DVT/PE. FOLLOWS FOR: when out side running errands she can tell allergies are high; starts coughing. Increased seasonal rhinitis symptoms and light cough if stays outside long now- tree pollens. Resumed flonase. We discussed dust mask for yard work. She is satisfied.  Continues Advair 500. Discussed cost and alternatives.   06/07/13- 62 yoF never smoker here for f/u of allergic rhinitis and asthma/ NSAIDs, with hx DVT/PE. FOLLOWS FOR: has been doing well overall; got through the ragweed season and had been exposed to some "cold/flu" had fever x 2 days; started prednisone and got better (this was around the first part of October) Now back to baseline with last prednisone 2 weeks ago.  Review of Systems-see HPI Constitutional:   No weight loss, night sweats,  Fevers, chills, fatigue, lassitude. HEENT:   No headaches,  Difficulty swallowing,  Tooth/dental problems,  Sore throat,                + sneezing, itching, ear ache, nasal congestion, post nasal drip,  CV:  No chest pain,  Orthopnea, PND,  swelling in lower extremities, anasarca, dizziness, palpitations GI  No heartburn, indigestion, abdominal pain, nausea, vomiting,  Resp: No shortness of breath at rest.  No excess mucus, no productive cough, no- Non-productive cough,  No coughing up of blood.  No change in color of mucus.  No recent wheezing.   Skin: no rash or lesions. GU: no dysuria, . MS:  No joint pain . Psych:  No change in mood or affect. No depression or  anxiety.  No memory loss.  Objective:   Physical Exam General- Alert, Oriented, Affect-appropriate, Distress- none acute,  overweight Skin- rash-none, lesions- none, excoriation- none; pale complexion Lymphadenopathy- none Head- atraumatic            Eyes- Gross vision intact, PERRLA, conjunctivae clear secretions            Ears- Hearing, canals normal            Nose- Clear, No-Septal dev, mucus, polyps, erosion, perforation             Throat- Mallampati II-III , mucosa clear , drainage- none, tonsils- atrophic Neck- flexible , trachea midline, no stridor , thyroid nl, carotid no bruit Chest - symmetrical excursion , unlabored           Heart/CV- RRR , no murmur , no gallop  , no rub, nl s1 s2                           - JVD- none , edema- none, stasis changes- none, varices-            Lung- clear to P&A, wheeze- none, no cough , dullness-none, rub- none           Chest wall-  Abd-   Br/ Gen/ Rectal- Not done, not indicated Extrem- cyanosis- none, clubbing, none, atrophy- none, strength- nl Neuro- grossly intact to observation

## 2013-06-07 NOTE — Patient Instructions (Signed)
Flu vax- standard   Please call as needed

## 2013-06-23 DIAGNOSIS — J069 Acute upper respiratory infection, unspecified: Secondary | ICD-10-CM | POA: Insufficient documentation

## 2013-06-23 HISTORY — DX: Acute upper respiratory infection, unspecified: J06.9

## 2013-06-23 NOTE — Assessment & Plan Note (Signed)
Acute upper respiratory infection with tracheobronchitis, recently resolved. Plan-discussed prednisone. Refill prescription to hold. Flu vaccine

## 2013-08-02 ENCOUNTER — Other Ambulatory Visit: Payer: Self-pay | Admitting: Internal Medicine

## 2013-09-17 ENCOUNTER — Other Ambulatory Visit: Payer: Self-pay | Admitting: Internal Medicine

## 2013-09-30 ENCOUNTER — Other Ambulatory Visit: Payer: Self-pay | Admitting: Internal Medicine

## 2013-12-09 ENCOUNTER — Ambulatory Visit (INDEPENDENT_AMBULATORY_CARE_PROVIDER_SITE_OTHER): Payer: 59 | Admitting: Internal Medicine

## 2013-12-09 ENCOUNTER — Encounter: Payer: Self-pay | Admitting: Internal Medicine

## 2013-12-09 VITALS — BP 128/84 | HR 99 | Ht 63.5 in | Wt 186.0 lb

## 2013-12-09 DIAGNOSIS — J309 Allergic rhinitis, unspecified: Secondary | ICD-10-CM

## 2013-12-09 DIAGNOSIS — J3089 Other allergic rhinitis: Principal | ICD-10-CM

## 2013-12-09 DIAGNOSIS — J302 Other seasonal allergic rhinitis: Secondary | ICD-10-CM

## 2013-12-09 DIAGNOSIS — J45909 Unspecified asthma, uncomplicated: Secondary | ICD-10-CM

## 2013-12-09 NOTE — Patient Instructions (Signed)
We can continue present meds  Watch your insurance formulary to see what is preferred in the Advair category- They might give you a better price on Symbicort, Dulera or Breo  Please call as needed

## 2013-12-09 NOTE — Assessment & Plan Note (Addendum)
Well controlled  Meds discussed

## 2013-12-09 NOTE — Progress Notes (Signed)
Patient ID: Andrea [name], female    DOB: [birth year], 63 y.o.   MRN: [id number]  HPI Andrea [name] never smoker here for f/u of allergic rhinitis and asthma, with hx DVT/PE. Last here April 22, 2010. She is trying not to go outside much in this pollen season. Still works at home, which has reduced her sick days and ER trips (none since 2010). Uses rescue inhaler usually about 3-4 x/ week. In last week she is using it or the nebulizer 2-3x/ day. She is using Foradil and Asmanex as cheaper than Advair.. Last prednisone was with a cold in February. She feels her status now is typical for this time of year. She has never used allergy vaccine or Xolair. Last hosp 2006.  IgE level 05/08/2009 was 25.4 with no elevations on allergy profile. Wakes some nights, tight, needing neb or rescue inhaler.   04/11/11- Andrea [name] never smoker here for f/u of allergic rhinitis and asthma, with hx DVT/PE. Going to PT for low back pain and she asks about tolerance to anti-inflammatories. In past she reacted to Vioxx with asthma w/in 1 hour of single dose. In 2010 shortly after eating a lot of strawberries and having taken motrin and aleve over a couple of weeks for dental work , she got diffuse flush, chest tight, cough> ER. In summer, 2011 had increased cough and wheeze. She suspects maybe strawberries from farmer's market. This year she avoided the strawberries. We discussed unreliability of our assays.  Has been using rescue inhaler once before exercising at gym. Exposure to perfume tightened her.   10/10/11-  Andrea [name] never smoker here for f/u of allergic rhinitis and asthma/ NSAIDs, with hx DVT/PE. Had a viral respiratory infection [place], finally resolving. Took prednisone because her peak flows were low and is now back to her normal peak flow of 450-500. Took no antibiotics. No longer taking prednisone. Has a history of wheezing with nonsteroidal anti-inflammatory drugs. Spring rhinitis has started, began Flonase.  Denies GERD.  04/16/12- Andrea [name] never smoker here for f/u of allergic rhinitis and asthma/ NSAIDs, with hx DVT/PE. Occasional flare up but overall has had a great summer. Had the vents in her home cleaned. Mild nasal congestion now with fall season. PFT: 10/30/2011-normal spirometry flows and volumes(FEV1/FVC 0.88) with insignificant response to bronchodilator. Diffusion mildly reduced 76%  10/15/12- Andrea [name] never smoker here for f/u of allergic rhinitis and asthma/ NSAIDs, with hx DVT/PE. FOLLOWS FOR: when out side running errands she can tell allergies are high; starts coughing. Increased seasonal rhinitis symptoms and light cough if stays outside long now- tree pollens. Resumed flonase. We discussed dust mask for yard work. She is satisfied.  Continues Advair 500. Discussed cost and alternatives.   06/07/13- Andrea [name] never smoker here for f/u of allergic rhinitis and asthma/ NSAIDs, with hx DVT/PE. FOLLOWS FOR: has been doing well overall; got through the ragweed season and had been exposed to some "cold/flu" had fever x 2 days; started prednisone and got better (this was around the first part of October) Now back to baseline with last prednisone 2 weeks ago.  12/09/13- Andrea [name] never smoker here for f/u of allergic rhinitis and asthma/ NSAIDs, with hx DVT/PE. FOLLOWS FOR: Sinus pressure and PND, has improved some over past week Needed prednisone during flare of rhinitis in early Spring, but substantially improved now. Mostly sensitive to Spring trees, Fall colds. Discussed past experience w/ allergy vaccine and availability of oral immunotherapy for grass and ragweed. Not  wheezing. Meds adequate.   Review of Systems-see HPI Constitutional:   No weight loss, night sweats,  Fevers, chills, fatigue, lassitude. HEENT:   No headaches,  Difficulty swallowing,  Tooth/dental problems,  Sore throat,                No-sneezing, itching, ear ache, +nasal congestion, post nasal drip,  CV:  No chest pain,   Orthopnea, PND, swelling in lower extremities, anasarca, dizziness, palpitations GI  No heartburn, indigestion, abdominal pain, nausea, vomiting,  Resp: No shortness of breath at rest.  No excess mucus, no productive cough, no- Non-productive cough,  No coughing up of blood.  No change in color of mucus.  No recent wheezing.   Skin: no rash or lesions. GU:  MS:  No joint pain . Psych:  No change in mood or affect. No depression or anxiety.  No memory loss.  Objective:   Physical Exam General- Alert, Oriented, Affect-appropriate, Distress- none acute,  overweight Skin- rash-none, lesions- none, excoriation- none; pale complexion Lymphadenopathy- none Head- atraumatic            Eyes- Gross vision intact, PERRLA, conjunctivae clear secretions            Ears- Hearing, canals normal            Nose- Clear, No-Septal dev, mucus, polyps, erosion, perforation             Throat- Mallampati II-III , mucosa clear , drainage- none, tonsils- atrophic Neck- flexible , trachea midline, no stridor , thyroid nl, carotid no bruit Chest - symmetrical excursion , unlabored           Heart/CV- RRR , no murmur , no gallop  , no rub, nl s1 s2                           - JVD- none , edema- none, stasis changes- none, varices-            Lung- clear to P&A, wheeze- none, no cough , dullness-none, rub- none           Chest wall-  Abd-  Br/ Gen/ Rectal- Not done, not indicated Extrem- cyanosis- none, clubbing, none, atrophy- none, strength- nl Neuro- grossly intact to observation

## 2013-12-09 NOTE — Assessment & Plan Note (Signed)
Resolving seasonal exacerbation. She took a prednisone burst during peak- discussed prednisone again.

## 2014-01-10 ENCOUNTER — Other Ambulatory Visit: Payer: Self-pay | Admitting: Internal Medicine

## 2014-01-11 ENCOUNTER — Other Ambulatory Visit: Payer: Self-pay | Admitting: Internal Medicine

## 2014-02-25 ENCOUNTER — Encounter (HOSPITAL_COMMUNITY): Payer: Self-pay | Admitting: Emergency Medicine

## 2014-02-25 ENCOUNTER — Emergency Department (HOSPITAL_COMMUNITY)
Admission: EM | Admit: 2014-02-25 | Discharge: 2014-02-25 | Disposition: A | Discharge status: Home / Self Care | Payer: 59 | Attending: Emergency Medicine | Admitting: Emergency Medicine

## 2014-02-25 ENCOUNTER — Emergency Department (HOSPITAL_COMMUNITY): Payer: 59

## 2014-02-25 DIAGNOSIS — R42 Dizziness and giddiness: Secondary | ICD-10-CM | POA: Insufficient documentation

## 2014-02-25 DIAGNOSIS — M810 Age-related osteoporosis without current pathological fracture: Secondary | ICD-10-CM | POA: Diagnosis not present

## 2014-02-25 DIAGNOSIS — Z872 Personal history of diseases of the skin and subcutaneous tissue: Secondary | ICD-10-CM | POA: Insufficient documentation

## 2014-02-25 DIAGNOSIS — IMO0002 Reserved for concepts with insufficient information to code with codable children: Secondary | ICD-10-CM | POA: Insufficient documentation

## 2014-02-25 DIAGNOSIS — Z792 Long term (current) use of antibiotics: Secondary | ICD-10-CM | POA: Diagnosis not present

## 2014-02-25 DIAGNOSIS — H53149 Visual discomfort, unspecified: Secondary | ICD-10-CM | POA: Diagnosis not present

## 2014-02-25 DIAGNOSIS — H8110 Benign paroxysmal vertigo, unspecified ear: Secondary | ICD-10-CM | POA: Insufficient documentation

## 2014-02-25 DIAGNOSIS — Z8679 Personal history of other diseases of the circulatory system: Secondary | ICD-10-CM | POA: Diagnosis not present

## 2014-02-25 DIAGNOSIS — J45909 Unspecified asthma, uncomplicated: Secondary | ICD-10-CM | POA: Diagnosis not present

## 2014-02-25 DIAGNOSIS — Z79899 Other long term (current) drug therapy: Secondary | ICD-10-CM | POA: Insufficient documentation

## 2014-02-25 DIAGNOSIS — Z86711 Personal history of pulmonary embolism: Secondary | ICD-10-CM | POA: Insufficient documentation

## 2014-02-25 LAB — CBC WITH DIFFERENTIAL/PLATELET
BASOS ABS: 0 10*3/uL (ref 0.0–0.1)
Basophils Relative: 0 % (ref 0–1)
Eosinophils Absolute: 0.2 10*3/uL (ref 0.0–0.7)
Eosinophils Relative: 2 % (ref 0–5)
HCT: 36.6 % (ref 36.0–46.0)
Hemoglobin: 12.3 g/dL (ref 12.0–15.0)
LYMPHS ABS: 0.9 10*3/uL (ref 0.7–4.0)
LYMPHS PCT: 11 % — AB (ref 12–46)
MCH: 29.6 pg (ref 26.0–34.0)
MCHC: 33.6 g/dL (ref 30.0–36.0)
MCV: 88 fL (ref 78.0–100.0)
Monocytes Absolute: 0.7 10*3/uL (ref 0.1–1.0)
Monocytes Relative: 8 % (ref 3–12)
NEUTROS ABS: 6.6 10*3/uL (ref 1.7–7.7)
Neutrophils Relative %: 79 % — ABNORMAL HIGH (ref 43–77)
PLATELETS: 207 10*3/uL (ref 150–400)
RBC: 4.16 MIL/uL (ref 3.87–5.11)
RDW: 12.9 % (ref 11.5–15.5)
WBC: 8.3 10*3/uL (ref 4.0–10.5)

## 2014-02-25 LAB — COMPREHENSIVE METABOLIC PANEL
ALT: 48 U/L — AB (ref 0–35)
AST: 38 U/L — AB (ref 0–37)
Albumin: 4.1 g/dL (ref 3.5–5.2)
Alkaline Phosphatase: 91 U/L (ref 39–117)
Anion gap: 12 (ref 5–15)
BILIRUBIN TOTAL: 0.5 mg/dL (ref 0.3–1.2)
BUN: 12 mg/dL (ref 6–23)
CHLORIDE: 101 meq/L (ref 96–112)
CO2: 27 meq/L (ref 19–32)
Calcium: 9.2 mg/dL (ref 8.4–10.5)
Creatinine, Ser: 0.91 mg/dL (ref 0.50–1.10)
GFR calc Af Amer: 77 mL/min — ABNORMAL LOW (ref >90)
GFR, EST NON AFRICAN AMERICAN: 66 mL/min — AB (ref >90)
Glucose, Bld: 123 mg/dL — ABNORMAL HIGH (ref 70–99)
Potassium: 3.4 mEq/L — ABNORMAL LOW (ref 3.7–5.3)
SODIUM: 140 meq/L (ref 137–147)
Total Protein: 7.1 g/dL (ref 6.0–8.3)

## 2014-02-25 LAB — PROTIME-INR
INR: 1.03 (ref 0.00–1.49)
Prothrombin Time: 13.5 seconds (ref 11.6–15.2)

## 2014-02-25 MED ORDER — MECLIZINE HCL 25 MG PO TABS
25.0000 mg | ORAL_TABLET | Freq: Once | ORAL | Status: AC
  Administered 2014-02-25: 25 mg via ORAL
  Filled 2014-02-25: qty 1

## 2014-02-25 MED ORDER — MECLIZINE HCL 12.5 MG PO TABS: 12.5000 mg | ORAL_TABLET | Freq: Three times a day (TID) | ORAL | Status: DC | PRN

## 2014-02-25 MED ORDER — SODIUM CHLORIDE 0.9 % IV BOLUS (SEPSIS)
1000.0000 mL | Freq: Once | INTRAVENOUS | Status: AC
  Administered 2014-02-25: 1000 mL via INTRAVENOUS

## 2014-02-25 MED ORDER — METOCLOPRAMIDE HCL 5 MG/ML IJ SOLN
10.0000 mg | INTRAMUSCULAR | Status: AC
  Administered 2014-02-25: 10 mg via INTRAVENOUS
  Filled 2014-02-25: qty 2

## 2014-02-25 NOTE — ED Notes (Signed)
EKG given to EDP, Zavitz,MD., for review. 

## 2014-02-25 NOTE — ED Notes (Signed)
Pt ambulated independently around room with a stead gait.  Pt denies any dizziness at this time.

## 2014-02-25 NOTE — Discharge Instructions (Signed)
Benign Positional Vertigo Vertigo means you feel like you or your surroundings are moving when they are not. Benign positional vertigo is the most common form of vertigo. Benign means that the cause of your condition is not serious. Benign positional vertigo is more common in older adults. CAUSES  Benign positional vertigo is the result of an upset in the labyrinth system. This is an area in the middle ear that helps control your balance. This may be caused by a viral infection, head injury, or repetitive motion. However, often no specific cause is found. SYMPTOMS  Symptoms of benign positional vertigo occur when you move your head or eyes in different directions. Some of the symptoms may include:  Loss of balance and falls.  Vomiting.  Blurred vision.  Dizziness.  Nausea.  Involuntary eye movements (nystagmus). DIAGNOSIS  Benign positional vertigo is usually diagnosed by physical exam. If the specific cause of your benign positional vertigo is unknown, your caregiver may perform imaging tests, such as magnetic resonance imaging (MRI) or computed tomography (CT). TREATMENT  Your caregiver may recommend movements or procedures to correct the benign positional vertigo. Medicines such as meclizine, benzodiazepines, and medicines for nausea may be used to treat your symptoms. In rare cases, if your symptoms are caused by certain conditions that affect the inner ear, you may need surgery. HOME CARE INSTRUCTIONS   Follow your caregiver's instructions.  Move slowly. Do not make sudden body or head movements.  Avoid driving.  Avoid operating heavy machinery.  Avoid performing any tasks that would be dangerous to you or others during a vertigo episode.  Drink enough fluids to keep your urine clear or pale yellow. SEEK IMMEDIATE MEDICAL CARE IF:   You develop problems with walking, weakness, numbness, or using your arms, hands, or legs.  You have difficulty speaking.  You develop  severe headaches.  Your nausea or vomiting continues or gets worse.  You develop visual changes.  Your family or friends notice any behavioral changes.  Your condition gets worse.  You have a fever.  You develop a stiff neck or sensitivity to light. MAKE SURE YOU:   Understand these instructions.  Will watch your condition.  Will get help right away if you are not doing well or get worse. Document Released: 04/28/2006 Document Revised: 10/13/2011 Document Reviewed: 04/10/2011 Exit[employer] Patient Information 2015 [employer]. This information is not intended to replace advice given to you by your health care provider. Make sure you discuss any questions you have with your health care provider.    

## 2014-02-25 NOTE — ED Provider Notes (Signed)
CSN: [id number]     Arrival date & time 02/25/14  0017 History   First MD Initiated Contact with Patient 02/25/14 0118     Chief Complaint  Patient presents with  . Dizziness  . 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 Symptoms began at 2100 yesterday evening. Patient states that things in front of her were "tilty tilty and spinning". She cannot articulate any direction to her vertiginous symptoms. Patient states the symptoms were associated with nausea and multiple episodes of nonbloody emesis. Emesis was followed by dry heaves. She also noted a small amount of pressure along her left mandibular angle as well as photophobia. Patient denies any history of similar symptoms. She denies associated fever, syncope, vision loss, hearing changes, facial drooping, difficulty speaking or swallowing, chest pain, shortness of breath, headache, abdominal pain, numbness/paresthesias, and extremity weakness. Patient does state her symptoms were preceded by a fullness in her ears.  Patient is a 63 y.o. female presenting with dizziness and vomiting. The history is provided by the patient. No language interpreter was used.  Dizziness Associated symptoms: nausea and vomiting   Associated symptoms: no chest pain, no headaches and no shortness of breath   Emesis Associated symptoms: no abdominal pain and no headaches     Past Medical History  Diagnosis Date  . Post-phlebitic syndrome   . 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 Osteoporosis   . Allergic rhinitis, cause unspecified   . Unspecified asthma(493.90)   . Urticaria 2010   Past Surgical History  Procedure Laterality Date  . Vesicovaginal fistula closure  w/ tah    . Bladder repair    . Appendectomy    . Tonsillectomy     Family History  Problem Relation Age of Onset  . Von Willebrand disease Sister   . Stroke    . Heart attack    . Lupus Mother   . Rheum arthritis Mother   . Heart disease Mother   . Asthma Sister    History  Substance Use Topics  . Smoking status: Never Smoker   . Smokeless tobacco: Never Used  . Alcohol Use: 0.0 oz/week   OB History   Grav Para Term Preterm Abortions TAB SAB Ect Mult Living                  Review of Systems  Constitutional: Negative for fever.  Eyes: Positive for photophobia.  Respiratory: Negative for shortness of breath.   Cardiovascular: Negative for chest pain.  Gastrointestinal: Positive for nausea and vomiting. Negative for abdominal pain.  Musculoskeletal: Negative for neck pain.  Neurological: Positive for dizziness. Negative for facial asymmetry, weakness and headaches.  All other systems reviewed and are negative.    Allergies  Moxifloxacin and Rofecoxib  Home Medications   Prior to Admission medications   Medication Sig Start Date End Date Taking? Authorizing Provider  albuterol (PROVENTIL HFA;VENTOLIN HFA) 108 (90 BASE) MCG/ACT inhaler Inhale 1 puff into the lungs every 6 (six) hours as needed for wheezing or shortness of breath.   Yes Historical Provider, MD  albuterol (PROVENTIL) (2.5 MG/3ML) 0.083% nebulizer solution Take 2.5 mg by nebulization every 6 (six) hours as needed for wheezing or shortness of breath.   Yes Historical Provider, MD  alendronate (FOSAMAX) 70  MG tablet Take 70 mg by mouth every 7 (seven) days. Take with a full glass of water on an empty stomach.    Yes Historical Provider, MD  amoxicillin (AMOXIL) 500 MG capsule Take 500 mg by mouth 4 (four) times daily.    Yes Historical Provider, MD  Calcium Carbonate-Vitamin D (CALCIUM-VITAMIN D) 500-200 MG-UNIT per tablet Take 2 tablets by mouth daily.     Yes Historical Provider, MD  Cholecalciferol  (VITAMIN D3) 2000 UNITS TABS Take 1 tablet by mouth daily.   Yes Historical Provider, MD  EPINEPHrine (EPIPEN IJ) Inject as directed. Due to severe allergic reaction   Yes Historical Provider, MD  fluticasone (FLONASE) 50 MCG/ACT nasal spray Place 2 sprays into both nostrils daily.   Yes Historical Provider, MD  Fluticasone-Salmeterol (ADVAIR) 500-50 MCG/DOSE AEPB Inhale 1 puff into the lungs 2 (two) times daily.   Yes Historical Provider, MD  LIPITOR 40 MG tablet Take 1 tablet by mouth Daily.   Yes Historical Provider, MD  montelukast (SINGULAIR) 10 MG tablet Take 10 mg by mouth at bedtime.   Yes Historical Provider, MD  tiotropium (SPIRIVA) 18 MCG inhalation capsule Place 18 mcg into inhaler and inhale daily.   Yes Historical Provider, MD  meclizine (ANTIVERT) 12.5 MG tablet Take 1 tablet (12.5 mg total) by mouth 3 (three) times daily as needed for dizziness. 02/25/14   Antony Madura, PA-C   BP 114/70  Pulse 69  Temp(Src) 97.7 F (36.5 C) (Oral)  Resp 19  SpO2 98%  Physical Exam  Nursing note and vitals reviewed. Constitutional: She is oriented to person, place, and time. She appears well-developed and well-nourished. No distress.  Nontoxic/nonseptic appearing  HENT:  Head: Normocephalic and atraumatic.  Eyes: Conjunctivae and EOM are normal. Pupils are equal, round, and reactive to light. No scleral icterus.  Pupils equal round and reactive to direct and consensual light. Normal EOMs. No nystagmus.  Neck: Normal range of motion.  No nuchal rigidity or meningismus  Cardiovascular: Normal rate, regular rhythm and normal heart sounds.   Pulmonary/Chest: Effort normal and breath sounds normal. No respiratory distress. She has no wheezes. She has no rales.  Musculoskeletal: Normal range of motion.  Neurological: She is alert and oriented to person, place, and time. No cranial nerve deficit. She exhibits normal muscle tone. Coordination normal.  GCS 15. He should speaks in full cold oriented  sentences. No cranial nerve deficits appreciated; symmetric eyebrow raise, no facial drooping, tongue midline. Patient moves extremities without ataxia. No gross sensory deficits on exam. Finger to nose intact b/l. No pronator drift.  Skin: Skin is warm and dry. No rash noted. She is not diaphoretic. No erythema. No pallor.  Psychiatric: She has a normal mood and affect. Her behavior is normal.    ED Course  Procedures (including critical care time) Labs Review Labs Reviewed  CBC WITH DIFFERENTIAL - Abnormal; Notable for the following:    Neutrophils Relative % 79 (*)    Lymphocytes Relative 11 (*)    All other components within normal limits  COMPREHENSIVE METABOLIC PANEL - Abnormal; Notable for the following:    Potassium 3.4 (*)    Glucose, Bld 123 (*)    AST 38 (*)    ALT 48 (*)    GFR calc non Af Amer 66 (*)    GFR calc Af Amer 77 (*)    All other components within normal limits  PROTIME-INR   Imaging Review Ct Head Wo Contrast  02/25/2014  CLINICAL DATA:  Vertigo and vomiting.  Nausea.  EXAM: CT HEAD WITHOUT CONTRAST  TECHNIQUE: Contiguous axial images were obtained from the base of the skull through the vertex without intravenous contrast.  COMPARISON:  MRI of the cervical spine performed 02/25/2008  FINDINGS: There is no evidence of acute infarction, mass lesion, or intra- or extra-axial hemorrhage on CT.  The posterior fossa, including the cerebellum, brainstem and fourth ventricle, is within normal limits. The third and lateral ventricles, and basal ganglia are unremarkable in appearance. The cerebral hemispheres are symmetric in appearance, with normal gray-white differentiation. No mass effect or midline shift is seen.  There is no evidence of fracture; visualized osseous structures are unremarkable in appearance. The visualized portions of the orbits are within normal limits. The paranasal sinuses and mastoid air cells are well-aerated. No significant soft tissue abnormalities  are seen.  IMPRESSION: Unremarkable noncontrast CT of the head.   Electronically Signed   By: Roanna Raider M.D.   On: 02/25/2014 02:05     EKG Interpretation None      MDM   Final diagnoses:  BPPV (benign paroxysmal positional vertigo), unspecified laterality    63 year old female presents to the emergency department for sudden onset vertigo with associated nausea and emesis. Patient also endorsing some mild associated fracture to her left mandibular angle and sensitivity to light. Patient states that symptoms have been improving since onset, especially since arrival in ED. Neurologic exam today is nonfocal. EOMs normal without nystagmus.  Workup today has been without concerning or emergent findings. CT head unremarkable, without hemorrhage, hydrocephalus, mass lesion, or infarct. Patient has had continued improvement over ED course with Reglan, Meclizine, and IVF. Patient able to ambulate independently with a steady gait and without dizziness. Symptoms likely secondary to benign paroxysmal positional vertigo. Patient stable and appropriate for discharge with instruction to followup with her primary care provider. Return precautions also provided. Patient agreeable to plan with no unaddressed concerns.   Filed Vitals:   02/25/14 0028 02/25/14 0300 02/25/14 0355  BP: 152/84 140/70 114/70  Pulse: 63 73 69  Temp: 97.6 F (36.4 C)  97.7 F (36.5 C)  TempSrc: Oral  Oral  Resp: 16 13 19   SpO2: 98%  98%       Antony Madura, PA-C 02/26/14 [phone]

## 2014-02-25 NOTE — ED Notes (Signed)
Pt c/o "sudden onset of vertigo with projectile vomiting". Pt states symptoms started at 2100 last night. Pt states she is still a little nauseous with "dry heaves". Pt alert, no acute distress. Pt states she feels a little pressure under the L side of her chin. Pt is sensitive to light. Pt states this has never happened to her before. Skin pale, warm, dry.

## 2014-03-01 NOTE — ED Provider Notes (Signed)
Medical screening examination/treatment/procedure(s) were conducted as a shared visit with non-physician practitioner(s) or resident  and myself.  I personally evaluated the patient during the encounter and agree with the findings.   I have personally reviewed any xrays and/ or EKG's with the provider and I agree with interpretation.   Patient presented today after sudden onset of vertigo that happened yesterday evening. Patient felt spinning sensation initial difficulty with balance is improved. Patient received supportive medications in ER and currently has no symptoms and feels much improved. Patient denies stroke history. 5+ strength in UE and LE with f/e at major joints. Sensation to palpation intact in UE and LE. CNs 2-12 grossly intact.  EOMFI.  PERRL.   Finger nose and coordination intact bilateral.   Visual fields intact to finger testing. Normal gait in the ER. Well-appearing patient. CT head unremarkable. Discussed MRI would provide further detail however patient has normal neuro exam, no symptoms and sexual followup outpatient return for recurrent symptoms. At this time it appears more likely peripheral cause of vertigo.    Andrea SkeensJoshua M Nadja Lina, MD 03/01/14 [phone]

## 2014-03-20 ENCOUNTER — Other Ambulatory Visit: Payer: Self-pay | Admitting: Internal Medicine

## 2014-05-26 ENCOUNTER — Other Ambulatory Visit: Payer: Self-pay | Admitting: Internal Medicine

## 2014-06-02 ENCOUNTER — Other Ambulatory Visit: Payer: Self-pay | Admitting: Internal Medicine

## 2014-06-09 NOTE — Telephone Encounter (Signed)
CY Please advise on refill; I do not see this Rx on file for patient. Thanks

## 2014-06-10 NOTE — Telephone Encounter (Signed)
Ok to refill 

## 2014-06-19 ENCOUNTER — Ambulatory Visit: Payer: 59 | Admitting: Internal Medicine

## 2014-06-28 ENCOUNTER — Other Ambulatory Visit: Payer: Self-pay | Admitting: *Deleted

## 2014-06-28 MED ORDER — MONTELUKAST SODIUM 10 MG PO TABS: 10.0000 mg | ORAL_TABLET | Freq: Every day | ORAL | Status: DC

## 2014-07-04 ENCOUNTER — Other Ambulatory Visit: Payer: Self-pay | Admitting: *Deleted

## 2014-07-04 MED ORDER — PREDNISONE 20 MG PO TABS: 20.0000 mg | ORAL_TABLET | ORAL | Status: DC

## 2014-07-11 ENCOUNTER — Encounter: Payer: Self-pay | Admitting: Internal Medicine

## 2014-07-11 ENCOUNTER — Ambulatory Visit (INDEPENDENT_AMBULATORY_CARE_PROVIDER_SITE_OTHER): Payer: 59 | Admitting: Internal Medicine

## 2014-07-11 VITALS — BP 134/82 | HR 69 | Ht 63.5 in | Wt 186.0 lb

## 2014-07-11 DIAGNOSIS — J3089 Other allergic rhinitis: Principal | ICD-10-CM

## 2014-07-11 DIAGNOSIS — J309 Allergic rhinitis, unspecified: Secondary | ICD-10-CM

## 2014-07-11 DIAGNOSIS — J4520 Mild intermittent asthma, uncomplicated: Secondary | ICD-10-CM

## 2014-07-11 DIAGNOSIS — J302 Other seasonal allergic rhinitis: Secondary | ICD-10-CM

## 2014-07-11 NOTE — Patient Instructions (Signed)
Script and sample if available for:  Incruse Ellipta  1 puff, once daily    Try this instead of Spiriva  Breo 200 Ellipta  1 puff and rinse mouth, once daily  Try instead of Advair

## 2014-07-11 NOTE — Assessment & Plan Note (Signed)
Antihistamines and nasal sprays if needed

## 2014-07-11 NOTE — Progress Notes (Signed)
Patient ID: Andrea [name], female    DOB: [birth year], 63 y.o.   MRN: [id number]  HPI 59yoF never smoker here for f/u of allergic rhinitis and asthma, with hx DVT/PE. Last here April 22, 2010. She is trying not to go outside much in this pollen season. Still works at home, which has reduced her sick days and ER trips (none since 2010). Uses rescue inhaler usually about 3-4 x/ week. In last week she is using it or the nebulizer 2-3x/ day. She is using Foradil and Asmanex as cheaper than Advair.. Last prednisone was with a cold in February. She feels her status now is typical for this time of year. She has never used allergy vaccine or Xolair. Last hosp 2006.  IgE level 05/08/2009 was 25.4 with no elevations on allergy profile. Wakes some nights, tight, needing neb or rescue inhaler.   04/11/11- 60yoF never smoker here for f/u of allergic rhinitis and asthma, with hx DVT/PE. Going to PT for low back pain and she asks about tolerance to anti-inflammatories. In past she reacted to Vioxx with asthma w/in 1 hour of single dose. In 2010 shortly after eating a lot of strawberries and having taken motrin and aleve over a couple of weeks for dental work , she got diffuse flush, chest tight, cough> ER. In summer, 2011 had increased cough and wheeze. She suspects maybe strawberries from farmer's market. This year she avoided the strawberries. We discussed unreliability of our assays.  Has been using rescue inhaler once before exercising at gym. Exposure to perfume tightened her.   10/10/11-  60yoF never smoker here for f/u of allergic rhinitis and asthma/ NSAIDs, with hx DVT/PE. Had a viral respiratory infection [place] finally [place]. Took prednisone because her peak flows were low and is now back to her normal peak flow of 450-500. Took no antibiotics. No longer taking prednisone. Has a history of wheezing with nonsteroidal anti-inflammatory drugs. Spring rhinitis has started, began Flonase.  Denies GERD.  04/16/12- 61 yoF never smoker here for f/u of allergic rhinitis and asthma/ NSAIDs, with hx DVT/PE. Occasional flare up but overall has had a great summer. Had the vents in her home cleaned. Mild nasal congestion now with fall season. PFT: 10/30/2011-normal spirometry flows and volumes(FEV1/FVC 0.88) with insignificant response to bronchodilator. Diffusion mildly reduced 76%  10/15/12- 61 yoF never smoker here for f/u of allergic rhinitis and asthma/ NSAIDs, with hx DVT/PE. FOLLOWS FOR: when out side running errands she can tell allergies are high; starts coughing. Increased seasonal rhinitis symptoms and light cough if stays outside long now- tree pollens. Resumed flonase. We discussed dust mask for yard work. She is satisfied.  Continues Advair 500. Discussed cost and alternatives.   06/07/13- [age] yoF never smoker here for f/u of allergic rhinitis and asthma/ NSAIDs, with hx DVT/PE. FOLLOWS FOR: has been doing well overall; got through the ragweed season and had been exposed to some "cold/flu" had fever x 2 days; started prednisone and got better (this was around the first part of October) Now back to baseline with last prednisone 2 weeks ago.  12/09/13- [age] yoF never smoker here for f/u of allergic rhinitis and asthma/ NSAIDs, with hx DVT/PE. FOLLOWS FOR: Sinus pressure and PND, has improved some over past week Needed prednisone during flare of rhinitis in early Spring, but substantially improved now. Mostly sensitive to Spring trees, Fall colds. Discussed past experience w/ allergy vaccine and availability of oral immunotherapy for grass and ragweed. Not  wheezing. Meds adequate.   07/11/14- [age] yoF never smoker here for f/u of allergic rhinitis and asthma/ NSAIDs, with hx DVT/PE. FOLLOWS FOR: pt states she has been using rescue inhalers some with weather changes.  no complaints at this time . She does well except if she visits her daughter's home where there are chickens and paints,  then she gets chest tightness. We discussed use of rescue inhaler. She is continued Advair and Spiriva until recently when insurance changed. Now needs replacement.  Review of Systems-see HPI Constitutional:   No weight loss, night sweats,  Fevers, chills, fatigue, lassitude. HEENT:   No headaches,  Difficulty swallowing,  Tooth/dental problems,  Sore throat,                No-sneezing, itching, ear ache, +nasal congestion, post nasal drip,  CV:  No chest pain,  Orthopnea, PND, swelling in lower extremities, anasarca, dizziness, palpitations GI  No heartburn, indigestion, abdominal pain, nausea, vomiting,  Resp: No shortness of breath at rest.  No excess mucus, no productive cough, no- Non-productive cough,  No coughing up of blood.  No change in color of mucus.  + recent wheezing.   Skin: no rash or lesions. GU:  MS:  No joint pain . Psych:  No change in mood or affect. No depression or anxiety.  No memory loss.  Objective:   Physical Exam General- Alert, Oriented, Affect-appropriate, Distress- none acute,  overweight Skin- rash-none, lesions- none, excoriation- none; pale complexion Lymphadenopathy- none Head- atraumatic            Eyes- Gross vision intact, PERRLA, conjunctivae clear secretions            Ears- Hearing, canals normal            Nose- Clear, No-Septal dev, mucus, polyps, erosion, perforation             Throat- Mallampati II-III , mucosa clear , drainage- none, tonsils- atrophic Neck- flexible , trachea midline, no stridor , thyroid nl, carotid no bruit Chest - symmetrical excursion , unlabored           Heart/CV- RRR , no murmur , no gallop  , no rub, nl s1 s2                           - JVD- none , edema- none, stasis changes- none, varices-            Lung- clear to P&A, wheeze- none, no cough , dullness-none, rub- none           Chest wall-  Abd-  Br/ Gen/ Rectal- Not done, not indicated Extrem- cyanosis- none, clubbing, none, atrophy- none, strength- nl Neuro-  grossly intact to observation

## 2014-07-11 NOTE — Assessment & Plan Note (Signed)
Plan-sample Breo 200 Ellipta and Incruse Ellipta to replace Spiriva and Advair if her insurance can cover them. Medication access discussed area

## 2014-07-31 ENCOUNTER — Other Ambulatory Visit: Payer: Self-pay | Admitting: Internal Medicine

## 2014-08-02 ENCOUNTER — Other Ambulatory Visit: Payer: Self-pay

## 2014-08-02 MED ORDER — ALBUTEROL SULFATE HFA 108 (90 BASE) MCG/ACT IN AERS: 2.0000 | INHALATION_SPRAY | Freq: Four times a day (QID) | RESPIRATORY_TRACT | Status: DC | PRN

## 2014-10-29 ENCOUNTER — Other Ambulatory Visit: Payer: Self-pay | Admitting: Internal Medicine

## 2014-11-01 ENCOUNTER — Other Ambulatory Visit: Payer: Self-pay

## 2014-11-01 DIAGNOSIS — Z1231 Encounter for screening mammogram for malignant neoplasm of breast: Secondary | ICD-10-CM

## 2014-11-09 ENCOUNTER — Ambulatory Visit
Admission: RE | Admit: 2014-11-09 | Discharge: 2014-11-09 | Disposition: A | Discharge status: Home / Self Care | Payer: 59 | Source: Ambulatory Visit

## 2014-11-09 DIAGNOSIS — Z1231 Encounter for screening mammogram for malignant neoplasm of breast: Secondary | ICD-10-CM

## 2015-01-07 ENCOUNTER — Other Ambulatory Visit: Payer: Self-pay | Admitting: Internal Medicine

## 2015-01-10 ENCOUNTER — Ambulatory Visit (INDEPENDENT_AMBULATORY_CARE_PROVIDER_SITE_OTHER): Payer: 59 | Admitting: Internal Medicine

## 2015-01-10 VITALS — BP 128/70 | HR 76 | Ht 63.0 in | Wt 191.2 lb

## 2015-01-10 DIAGNOSIS — J309 Allergic rhinitis, unspecified: Secondary | ICD-10-CM

## 2015-01-10 DIAGNOSIS — J4520 Mild intermittent asthma, uncomplicated: Secondary | ICD-10-CM | POA: Diagnosis not present

## 2015-01-10 DIAGNOSIS — J302 Other seasonal allergic rhinitis: Secondary | ICD-10-CM

## 2015-01-10 DIAGNOSIS — J3089 Other allergic rhinitis: Principal | ICD-10-CM

## 2015-01-10 MED ORDER — UMECLIDINIUM BROMIDE 62.5 MCG/INH IN AEPB: 1.0000 | INHALATION_SPRAY | RESPIRATORY_TRACT | Status: DC

## 2015-01-10 NOTE — Progress Notes (Signed)
Patient ID: Andrea [name], female    DOB: [birth year], 64 y.o.   MRN: [id number]  HPI 64yoF never smoker here for f/u of allergic rhinitis and asthma, with hx DVT/PE. Last here April 22, 2010. She is trying not to go outside much in this pollen season. Still works at home, which has reduced her sick days and ER trips (none since 2010). Uses rescue inhaler usually about 3-4 x/ week. In last week she is using it or the nebulizer 2-3x/ day. She is using Foradil and Asmanex as cheaper than Advair.. Last prednisone was with a cold in February. She feels her status now is typical for this time of year. She has never used allergy vaccine or Xolair. Last hosp 2006.  IgE level 05/08/2009 was 25.4 with no elevations on allergy profile. Wakes some nights, tight, needing neb or rescue inhaler.   04/11/11- 64yoF never smoker here for f/u of allergic rhinitis and asthma, with hx DVT/PE. Going to PT for low back pain and she asks about tolerance to anti-inflammatories. In past she reacted to Vioxx with asthma w/in 1 hour of single dose. In 2010 shortly after eating a lot of strawberries and having taken motrin and aleve over a couple of weeks for dental work , she got diffuse flush, chest tight, cough> ER. In summer, 2011 had increased cough and wheeze. She suspects maybe strawberries from farmer's market. This year she avoided the strawberries. We discussed unreliability of our assays.  Has been using rescue inhaler once before exercising at gym. Exposure to perfume tightened her.   10/10/11-  64yoF never smoker here for f/u of allergic rhinitis and asthma/ NSAIDs, with hx DVT/PE. Had a viral respiratory infection around [place], finally resolving. Took prednisone because her peak flows were low and is now back to her normal peak flow of 450-500. Took no antibiotics. No longer taking prednisone. Has a history of wheezing with nonsteroidal anti-inflammatory drugs. Spring rhinitis has started, began Flonase.  Denies GERD.  04/16/12- 64 yoF never smoker here for f/u of allergic rhinitis and asthma/ NSAIDs, with hx DVT/PE. Occasional flare up but overall has had a great summer. Had the vents in her home cleaned. Mild nasal congestion now with fall season. PFT: 10/30/2011-normal spirometry flows and volumes(FEV1/FVC 0.88) with insignificant response to bronchodilator. Diffusion mildly reduced 76%  10/15/12- 64 yoF never smoker here for f/u of allergic rhinitis and asthma/ NSAIDs, with hx DVT/PE. FOLLOWS FOR: when out side running errands she can tell allergies are high; starts coughing. Increased seasonal rhinitis symptoms and light cough if stays outside long now- tree pollens. Resumed flonase. We discussed dust mask for yard work. She is satisfied.  Continues Advair 500. Discussed cost and alternatives.   06/07/13- [age] yoF never smoker here for f/u of allergic rhinitis and asthma/ NSAIDs, with hx DVT/PE. FOLLOWS FOR: has been doing well overall; got through the ragweed season and had been exposed to some "cold/flu" had fever x 2 days; started prednisone and got better (this was around the first part of October) Now back to baseline with last prednisone 2 weeks ago.  12/09/13- [age] yoF never smoker here for f/u of allergic rhinitis and asthma/ NSAIDs, with hx DVT/PE. FOLLOWS FOR: Sinus pressure and PND, has improved some over past week Needed prednisone during flare of rhinitis in early Spring, but substantially improved now. Mostly sensitive to Spring trees, Fall colds. Discussed past experience w/ allergy vaccine and availability of oral immunotherapy for grass and ragweed. Not  wheezing. Meds adequate.   07/11/14- [age] yoF never smoker here for f/u of allergic rhinitis and asthma/ NSAIDs, with hx DVT/PE. FOLLOWS FOR: pt states she has been using rescue inhalers some with weather changes.  no complaints at this time . She does well except if she visits her daughter's home where there are chickens and paints,  then she gets chest tightness. We discussed use of rescue inhaler. She is continued Advair and Spiriva until recently when insurance changed. Now needs replacement.  01/10/15- 63 yoF never smoker here for f/u of allergic rhinitis and asthma/ NSAIDs, with hx DVT/PE. Pt. states that she is able to control her allergies, denies SOB, wheezing and coughing Holding prednisone for taper if needed. Did this during the spring pollen season. We discussed steroid use and side effects again. Insurance prefers Incruse Ellipta over Spiriva  Review of Systems-see HPI Constitutional:   No weight loss, night sweats,  Fevers, chills, fatigue, lassitude. HEENT:   No headaches,  Difficulty swallowing,  Tooth/dental problems,  Sore throat,                No-sneezing, itching, ear ache, +nasal congestion, post nasal drip,  CV:  No chest pain,  Orthopnea, PND, swelling in lower extremities, anasarca, dizziness, palpitations GI  No heartburn, indigestion, abdominal pain, nausea, vomiting,  Resp: No shortness of breath at rest.  No excess mucus, no productive cough, no- Non-productive cough,  No coughing up of blood.  No change in color of mucus.                             No-recent wheezing.   Skin: no rash or lesions. GU:  MS:  No joint pain . Psych:  No change in mood or affect. No depression or anxiety.  No memory loss.  Objective:   Physical Exam General- Alert, Oriented, Affect-appropriate, Distress- none acute,                    +overweight Skin- rash-none, lesions- none, excoriation- none; pale complexion Lymphadenopathy- none Head- atraumatic            Eyes- Gross vision intact, PERRLA, conjunctivae clear secretions            Ears- Hearing, canals normal            Nose- Clear, No-Septal dev, mucus, polyps, erosion, perforation             Throat- Mallampati II-III , mucosa clear , drainage- none, tonsils- atrophic Neck- flexible , trachea midline, no stridor , thyroid nl, carotid no bruit Chest -  symmetrical excursion , unlabored           Heart/CV- RRR , no murmur , no gallop  , no rub, nl s1 s2                           - JVD- none , edema- none, stasis changes- none, varices-            Lung- clear to P&A, wheeze- none, no cough , dullness-none, rub- none           Chest wall-  Abd-  Br/ Gen/ Rectal- Not done, not indicated Extrem- cyanosis- none, clubbing, none, atrophy- none, strength- nl Neuro- grossly intact to observation

## 2015-01-10 NOTE — Patient Instructions (Signed)
We can continue present meds.   Incruse script has been sent to replace Spiriva because of insurance change

## 2015-02-05 ENCOUNTER — Encounter: Payer: Self-pay | Admitting: Internal Medicine

## 2015-02-05 NOTE — Assessment & Plan Note (Signed)
Good control for now. Mild persistent uncomplicated well-controlled. Plan-replace Spiriva with Incruse

## 2015-02-05 NOTE — Assessment & Plan Note (Signed)
Controlled with antihistamines and nasal steroids mostly

## 2015-03-03 ENCOUNTER — Other Ambulatory Visit: Payer: Self-pay | Admitting: Internal Medicine

## 2015-03-16 ENCOUNTER — Encounter: Payer: Self-pay | Admitting: Internal Medicine

## 2015-03-16 ENCOUNTER — Ambulatory Visit (INDEPENDENT_AMBULATORY_CARE_PROVIDER_SITE_OTHER): Payer: 59 | Admitting: Internal Medicine

## 2015-03-16 VITALS — BP 134/68 | HR 84 | Ht 63.0 in | Wt 189.8 lb

## 2015-03-16 DIAGNOSIS — J4521 Mild intermittent asthma with (acute) exacerbation: Secondary | ICD-10-CM

## 2015-03-16 DIAGNOSIS — J3089 Other allergic rhinitis: Secondary | ICD-10-CM

## 2015-03-16 DIAGNOSIS — J309 Allergic rhinitis, unspecified: Secondary | ICD-10-CM

## 2015-03-16 DIAGNOSIS — J302 Other seasonal allergic rhinitis: Secondary | ICD-10-CM

## 2015-03-16 NOTE — Assessment & Plan Note (Signed)
Viral syndrome upper respiratory infection with nasal congestion Plan-Sudafed with discussion

## 2015-03-16 NOTE — Patient Instructions (Signed)
Ok to continue the prednisone taper as directed  Ok to continue your usual meds  It may help your ears to add Sudafed from the pharmacy counter  Keep scheduled follow-up in December unless needed sooner

## 2015-03-16 NOTE — Assessment & Plan Note (Signed)
Exacerbation began as a viral upper respiratory infection. Responding slowly to steroids. No indication for antibiotic at this time.

## 2015-03-16 NOTE — Progress Notes (Signed)
Patient ID: Andrea [name], female    DOB: [birth year], 64 y.o.   MRN: [id number]  HPI 64yoF never smoker here for f/u of allergic rhinitis and asthma, with hx DVT/PE. Last here April 22, 2010. She is trying not to go outside much in this pollen season. Still works at home, which has reduced her sick days and ER trips (none since 2010). Uses rescue inhaler usually about 3-4 x/ week. In last week she is using it or the nebulizer 2-3x/ day. She is using Foradil and Asmanex as cheaper than Advair.. Last prednisone was with a cold in February. She feels her status now is typical for this time of year. She has never used allergy vaccine or Xolair. Last hosp 2006.  IgE level 05/08/2009 was 25.4 with no elevations on allergy profile. Wakes some nights, tight, needing neb or rescue inhaler.   04/11/11- 64yoF never smoker here for f/u of allergic rhinitis and asthma, with hx DVT/PE. Going to PT for low back pain and she asks about tolerance to anti-inflammatories. In past she reacted to Vioxx with asthma w/in 1 hour of single dose. In 2010 shortly after eating a lot of strawberries and having taken motrin and aleve over a couple of weeks for dental work , she got diffuse flush, chest tight, cough> ER. In summer, 2011 had increased cough and wheeze. She suspects maybe strawberries from farmer's market. This year she avoided the strawberries. We discussed unreliability of our assays.  Has been using rescue inhaler once before exercising at gym. Exposure to perfume tightened her.   10/10/11-  64yoF never smoker here for f/u of allergic rhinitis and asthma/ NSAIDs, with hx DVT/PE. Had a viral respiratory infection around [place], finally resolving. Took prednisone because her peak flows were low and is now back to her normal peak flow of 450-500. Took no antibiotics. No longer taking prednisone. Has a history of wheezing with nonsteroidal anti-inflammatory drugs. Spring rhinitis has started, began Flonase.  Denies GERD.  04/16/12- 64 yoF never smoker here for f/u of allergic rhinitis and asthma/ NSAIDs, with hx DVT/PE. Occasional flare up but overall has had a great summer. Had the vents in her home cleaned. Mild nasal congestion now with fall season. PFT: 10/30/2011-normal spirometry flows and volumes(FEV1/FVC 0.88) with insignificant response to bronchodilator. Diffusion mildly reduced 76%  10/15/12- 64 yoF never smoker here for f/u of allergic rhinitis and asthma/ NSAIDs, with hx DVT/PE. FOLLOWS FOR: when out side running errands she can tell allergies are high; starts coughing. Increased seasonal rhinitis symptoms and light cough if stays outside long now- tree pollens. Resumed flonase. We discussed dust mask for yard work. She is satisfied.  Continues Advair 500. Discussed cost and alternatives.   06/07/13- [age] yoF never smoker here for f/u of allergic rhinitis and asthma/ NSAIDs, with hx DVT/PE. FOLLOWS FOR: has been doing well overall; got through the ragweed season and had been exposed to some "cold/flu" had fever x 2 days; started prednisone and got better (this was around the first part of October) Now back to baseline with last prednisone 2 weeks ago.  12/09/13- [age] yoF never smoker here for f/u of allergic rhinitis and asthma/ NSAIDs, with hx DVT/PE. FOLLOWS FOR: Sinus pressure and PND, has improved some over past week Needed prednisone during flare of rhinitis in early Spring, but substantially improved now. Mostly sensitive to Spring trees, Fall colds. Discussed past experience w/ allergy vaccine and availability of oral immunotherapy for grass and ragweed. Not  wheezing. Meds adequate.   07/11/14- 64 yoF never smoker here for f/u of allergic rhinitis and asthma/ NSAIDs, with hx DVT/PE. FOLLOWS FOR: pt states she has been using rescue inhalers some with weather changes.  no complaints at this time . She does well except if she visits her daughter's home where there are chickens and paints,  then she gets chest tightness. We discussed use of rescue inhaler. She is continued Advair and Spiriva until recently when insurance changed. Now needs replacement.  01/10/15- 63 yoF never smoker here for f/u of allergic rhinitis and asthma/ NSAIDs, with hx DVT/PE. Pt. states that she is able to control her allergies, denies SOB, wheezing and coughing Holding prednisone for taper if needed. Did this during the spring pollen season. We discussed steroid use and side effects again. Insurance prefers Incruse Ellipta over Spiriva   03/16/15- 63 yoF never smoker here for f/u of allergic rhinitis and asthma/ NSAIDs, with hx DVT/PE. ACUTE VISIT: ? Bronchitis; URI started 02-24-15-seen PCP office on 03-01-15-was given high dose Pred taper; returned the following week and could not get below 30 mg Pred without coughing and also had fever;03-07-15; Increased Pred to 60 mg tapering every 2 daysby 10 mg and neb tx BID. Pt states fever is gone and congestion is gone but continues to have pressure in ears and cough-deep. Dizzy spells as well. Peak flow went down to 350-has come back up to 420-425. Using Clartin OTC for PND. EOS Nl 02/25/14 IgE 13.9 10/25/10 Current exacerbation began as a definite upper respiratory infection/viral syndrome in the last week of July with fever. Primary physician treated with prednisone taper from 60 mg and on follow-up repeated the taper. She is dropping by 10 mg every 2 days and getting slowly better. Peak flows are improving. Chest remains tight. Pressure in ears. No headache or purulent nasal discharge. Mild postnasal drip. Primary care did chest x-ray [facility] with report not yet available. Has her medications, discussed, including nebulizer machine.  Review of Systems-see HPI Constitutional:   No weight loss, night sweats,  Fevers, chills, fatigue, lassitude. HEENT:   No headaches,  Difficulty swallowing,  Tooth/dental problems,  Sore throat,                No-sneezing, itching, ear ache,  +nasal congestion, post nasal drip,  CV:  No chest pain,  Orthopnea, PND, swelling in lower extremities, anasarca, dizziness, palpitations GI  No heartburn, indigestion, abdominal pain, nausea, vomiting,  Resp: No shortness of breath at rest.  No excess mucus, no productive cough, no- Non-productive cough,  No coughing up of blood.   change in color of mucus. +recent wheezing.   Skin: no rash or lesions. GU:  MS:  No joint pain . Psych:  No change in mood or affect. No depression or anxiety.  No memory loss.  Objective:   Physical Exam General- Alert, Oriented, Affect-appropriate, Distress- none acute, +overweight Skin- rash-none, lesions- none, excoriation- none; pale complexion Lymphadenopathy- none Head- atraumatic            Eyes- Gross vision intact, PERRLA, conjunctivae clear secretions            Ears- Hearing, canals normal            Nose- Clear, No-Septal dev, mucus, polyps, erosion, perforation             Throat- Mallampati II-III , mucosa clear , drainage- none, tonsils- atrophic Neck- flexible , trachea midline, no stridor , thyroid nl, carotid no  bruit Chest - symmetrical excursion , unlabored           Heart/CV- RRR , no murmur , no gallop  , no rub, nl s1 s2                           - JVD- none , edema- none, stasis changes- none, varices-            Lung-  wheeze + light,  cough + dry , dullness-none, rub- none           Chest wall-  Abd-  Br/ Gen/ Rectal- Not done, not indicated Extrem- cyanosis- none, clubbing, none, atrophy- none, strength- nl Neuro- grossly intact to observation

## 2015-07-12 ENCOUNTER — Ambulatory Visit (INDEPENDENT_AMBULATORY_CARE_PROVIDER_SITE_OTHER): Payer: 59 | Admitting: Internal Medicine

## 2015-07-12 ENCOUNTER — Encounter: Payer: Self-pay | Admitting: Internal Medicine

## 2015-07-12 VITALS — BP 124/62 | HR 75 | Ht 63.0 in | Wt 184.6 lb

## 2015-07-12 DIAGNOSIS — Z23 Encounter for immunization: Secondary | ICD-10-CM

## 2015-07-12 DIAGNOSIS — J302 Other seasonal allergic rhinitis: Secondary | ICD-10-CM

## 2015-07-12 DIAGNOSIS — J3089 Other allergic rhinitis: Secondary | ICD-10-CM

## 2015-07-12 DIAGNOSIS — J4520 Mild intermittent asthma, uncomplicated: Secondary | ICD-10-CM

## 2015-07-12 DIAGNOSIS — J309 Allergic rhinitis, unspecified: Secondary | ICD-10-CM

## 2015-07-12 NOTE — Patient Instructions (Signed)
Flu vax  Given 2 samples of Incruse Ellipta    Continue inhaling 1 puff, once daily

## 2015-07-12 NOTE — Progress Notes (Signed)
Patient ID: Andrea [name], female    DOB: [birth year], 64 y.o.   MRN: [id number]  HPI 59yoF never smoker here for f/u of allergic rhinitis and asthma, with hx DVT/PE. Last here April 22, 2010. She is trying not to go outside much in this pollen season. Still works at home, which has reduced her sick days and ER trips (none since 2010). Uses rescue inhaler usually about 3-4 x/ week. In last week she is using it or the nebulizer 2-3x/ day. She is using Foradil and Asmanex as cheaper than Advair.. Last prednisone was with a cold in February. She feels her status now is typical for this time of year. She has never used allergy vaccine or Xolair. Last hosp 2006.  IgE level 05/08/2009 was 25.4 with no elevations on allergy profile. Wakes some nights, tight, needing neb or rescue inhaler.   04/11/11- 60yoF never smoker here for f/u of allergic rhinitis and asthma, with hx DVT/PE. Going to PT for low back pain and she asks about tolerance to anti-inflammatories. In past she reacted to Vioxx with asthma w/in 1 hour of single dose. In 2010 shortly after eating a lot of strawberries and having taken motrin and aleve over a couple of weeks for dental work , she got diffuse flush, chest tight, cough> ER. In summer, 2011 had increased cough and wheeze. She suspects maybe strawberries from farmer's market. This year she avoided the strawberries. We discussed unreliability of our assays.  Has been using rescue inhaler once before exercising at gym. Exposure to perfume tightened her.   10/10/11-  60yoF never smoker here for f/u of allergic rhinitis and asthma/ NSAIDs, with hx DVT/PE. Had a viral respiratory infection around [place], finally resolving. Took prednisone because her peak flows were low and is now back to her normal peak flow of 450-500. Took no antibiotics. No longer taking prednisone. Has a history of wheezing with nonsteroidal anti-inflammatory drugs. Spring rhinitis has started, began Flonase.  Denies GERD.  04/16/12- 61 yoF never smoker here for f/u of allergic rhinitis and asthma/ NSAIDs, with hx DVT/PE. Occasional flare up but overall has had a great summer. Had the vents in her home cleaned. Mild nasal congestion now with fall season. PFT: 10/30/2011-normal spirometry flows and volumes(FEV1/FVC 0.88) with insignificant response to bronchodilator. Diffusion mildly reduced 76%  10/15/12- 61 yoF never smoker here for f/u of allergic rhinitis and asthma/ NSAIDs, with hx DVT/PE. FOLLOWS FOR: when out side running errands she can tell allergies are high; starts coughing. Increased seasonal rhinitis symptoms and light cough if stays outside long now- tree pollens. Resumed flonase. We discussed dust mask for yard work. She is satisfied.  Continues Advair 500. Discussed cost and alternatives.   06/07/13- 64 yoF never smoker here for f/u of allergic rhinitis and asthma/ NSAIDs, with hx DVT/PE. FOLLOWS FOR: has been doing well overall; got through the ragweed season and had been exposed to some "cold/flu" had fever x 2 days; started prednisone and got better (this was around the first part of October) Now back to baseline with last prednisone 2 weeks ago.  12/09/13- 64 yoF never smoker here for f/u of allergic rhinitis and asthma/ NSAIDs, with hx DVT/PE. FOLLOWS FOR: Sinus pressure and PND, has improved some over past week Needed prednisone during flare of rhinitis in early Spring, but substantially improved now. Mostly sensitive to Spring trees, Fall colds. Discussed past experience w/ allergy vaccine and availability of oral immunotherapy for grass and ragweed. Not  wheezing. Meds adequate.   07/11/14- 64 yoF never smoker here for f/u of allergic rhinitis and asthma/ NSAIDs, with hx DVT/PE. FOLLOWS FOR: pt states she has been using rescue inhalers some with weather changes.  no complaints at this time . She does well except if she visits her daughter's home where there are chickens and paints,  then she gets chest tightness. We discussed use of rescue inhaler. She is continued Advair and Spiriva until recently when insurance changed. Now needs replacement.  01/10/15- 63 yoF never smoker here for f/u of allergic rhinitis and asthma/ NSAIDs, with hx DVT/PE. Pt. states that she is able to control her allergies, denies SOB, wheezing and coughing Holding prednisone for taper if needed. Did this during the spring pollen season. We discussed steroid use and side effects again. Insurance prefers Incruse Ellipta over Spiriva  03/16/15- 63 yoF never smoker here for f/u of allergic rhinitis and asthma/ NSAIDs, with hx DVT/PE. ACUTE VISIT: ? Bronchitis; URI started 02-24-15-seen PCP office on 03-01-15-was given high dose Pred taper; returned the following week and could not get below 30 mg Pred without coughing and also had fever;03-07-15; Increased Pred to 60 mg tapering every 2 daysby 10 mg and neb tx BID. Pt states fever is gone and congestion is gone but continues to have pressure in ears and cough-deep. Dizzy spells as well. Peak flow went down to 350-has come back up to 420-425. Using Clartin OTC for PND. EOS WNl 02/25/14 IgE 13.9 10/25/10 Current exacerbation began as a definite upper respiratory infection/viral syndrome in the last week of July with fever. Primary physician treated with prednisone taper from 60 mg and on follow-up repeated the taper. She is dropping by 10 mg every 2 days and getting slowly better. Peak flows are improving. Chest remains tight. Pressure in ears. No headache or purulent nasal discharge. Mild postnasal drip. Primary care did chest x-ray [facility] with report not yet available. Has her medications, discussed, including nebulizer machine.  07/12/2015-64 year old female never smoker followed for allergic rhinitis, asthma/NSAIDs, complicated by history DVT/PE FOLLOWS FOR: Pt has cough that happens when she comes indoors from outdoors-usually goes away about 20 minutes. Pt has been  off Incruse for about 3 weeks-out of flex spending to pay for med. Her typical cough varies with temperature changes, going in and out of doors. No acute event. Has run out of Incruse inhaler.  Review of Systems-see HPI Constitutional:   No weight loss, night sweats,  Fevers, chills, fatigue, lassitude. HEENT:   No headaches,  Difficulty swallowing,  Tooth/dental problems,  Sore throat,                No-sneezing, itching, ear ache, +nasal congestion, post nasal drip,  CV:  No chest pain,  Orthopnea, PND, swelling in lower extremities, anasarca, dizziness, palpitations GI  No heartburn, indigestion, abdominal pain, nausea, vomiting,  Resp: No shortness of breath at rest.  No excess mucus, no productive cough, + Non-productive cough,  No coughing up of blood.   change in color of mucus. +recent wheezing.   Skin: no rash or lesions. GU:  MS:  No joint pain . Psych:  No change in mood or affect. No depression or anxiety.  No memory loss.  Objective:   Physical Exam General- Alert, Oriented, Affect-appropriate, Distress- none acute, +overweight Skin- rash-none, lesions- none, excoriation- none; pale complexion Lymphadenopathy- none Head- atraumatic            Eyes- Gross vision intact, PERRLA, conjunctivae clear secretions  Ears- Hearing, canals normal            Nose- Clear, No-Septal dev, mucus, polyps, erosion, perforation             Throat- Mallampati II-III , mucosa clear , drainage- none, tonsils- atrophic Neck- flexible , trachea midline, no stridor , thyroid nl, carotid no bruit Chest - symmetrical excursion , unlabored           Heart/CV- RRR , no murmur , no gallop  , no rub, nl s1 s2                           - JVD- none , edema- none, stasis changes- none, varices-            Lung-  wheeze-none  cough -none , dullness-none, rub- none           Chest wall-  Abd-  Br/ Gen/ Rectal- Not done, not indicated Extrem- cyanosis- none, clubbing, none, atrophy- none,  strength- nl Neuro- grossly intact to observation

## 2015-07-15 NOTE — Assessment & Plan Note (Signed)
No recent acute viral infections. Feels control is good currently.

## 2015-07-15 NOTE — Assessment & Plan Note (Signed)
Adequate control mild persistent uncomplicated. Plan-flu vaccine, samples of Incruse inhaler to last until she can fill prescription

## 2015-07-26 ENCOUNTER — Telehealth: Payer: Self-pay | Admitting: Internal Medicine

## 2015-07-26 NOTE — Telephone Encounter (Signed)
Called pt- FMLA filled in per patient Will give to CY to sign in AM and call patient when done.

## 2015-07-26 NOTE — Telephone Encounter (Signed)
Spoke with pt, checking on the status of FMLA forms.  States nothing has been sent back to [employer].  Because her paperwork was not filled out in the appropriate time frame, the only way the pt can get paid for her time out pt needs her forms filled out as well as a letter typed on letterhead acknowledging that we did not fill out this paperwork in the timeframe it was due.   Pt called Medical Records, was advised that paperwork has not yet been returned to medical records from our office.   Katie/CY please advise on the status of these forms.  Thanks!

## 2015-07-26 NOTE — Telephone Encounter (Signed)
Dr Maple HudsonYoung please advise on FMLA forms. Pt is calling requesting these. Thanks.

## 2015-07-26 NOTE — Telephone Encounter (Signed)
Per CY: Patient needs to be called and FMLA form need to be reviewed with the patient . Need to know what dates specifically [facility] so that we can ensure this is done correctly.

## 2015-07-27 NOTE — Telephone Encounter (Signed)
Forms completed and given to CY to review and sign.  Will send to [facility] to follow up on.

## 2015-07-27 NOTE — Telephone Encounter (Signed)
Has this been completed Ashtyn?  Please advise.

## 2015-07-28 ENCOUNTER — Other Ambulatory Visit: Payer: Self-pay | Admitting: Internal Medicine

## 2015-07-31 NOTE — Telephone Encounter (Signed)
Done

## 2015-07-31 NOTE — Telephone Encounter (Signed)
Called and spoke with patient. Informed her that FMLA has been completed and signed by CY. She request a letter stating why the FLMA paperwork was delayed. I explained to patient I will send the message to [facility] and [facility] for follow up. Patient voiced understanding and had no further questions.  Will forward message to [facility]

## 2015-08-01 ENCOUNTER — Encounter: Payer: Self-pay | Admitting: Internal Medicine

## 2015-08-01 NOTE — Telephone Encounter (Signed)
Spoke with pt, states the letter is not for her but for her FMLA (needs to be made out to [facility] and not [facility]), and needs to have pt's case # included in the letter. This letter will need to be faxed with her FMLA paperwork which was dropped off at our HIM dept.   Dr. Delena BaliYoung/Katie please advise when this corrected letter is typed up.  Thanks!

## 2015-08-01 NOTE — Telephone Encounter (Signed)
Done and given to Katie to mail

## 2015-08-02 ENCOUNTER — Encounter: Payer: Self-pay | Admitting: *Deleted

## 2015-08-02 NOTE — Telephone Encounter (Signed)
Spoke with Andrea [name] at HIM- Pt's case #: [id number] Fax #[phone]  Katie please advise when this is completed.  Thanks!

## 2015-08-02 NOTE — Telephone Encounter (Signed)
Andrea [name], we need to find her FMLA form (back at HR or already sent somewhere els), change the letter i mad yesterday to it goes to her [place].

## 2015-08-02 NOTE — Telephone Encounter (Signed)
Had to leave message for Andrea [name]( person who handles FMLA). We need to get patient's case # from her FMLA paperwork; then retype letter CY created adding patients case # and [facility].

## 2015-08-02 NOTE — Telephone Encounter (Signed)
Andrea [name] is calling back [phone]

## 2015-08-02 NOTE — Telephone Encounter (Signed)
Faxed to  Number provided

## 2015-08-03 NOTE — Telephone Encounter (Signed)
lmomtcb x1 for pt 

## 2015-08-03 NOTE — Telephone Encounter (Signed)
Patient returned call, CB is [phone].

## 2015-08-03 NOTE — Telephone Encounter (Signed)
LVM for Joni Reiningicole to return call to verify if fax was received.

## 2015-08-03 NOTE — Telephone Encounter (Signed)
Called spoke with pt. Aware forms/letter faxed yesterday afternoon. She needed nothing further.

## 2015-08-10 ENCOUNTER — Other Ambulatory Visit: Payer: Self-pay | Admitting: Internal Medicine

## 2015-08-10 NOTE — Telephone Encounter (Signed)
CY Please advise if okay to refill med. Looking through chart it appears she may be using other inhalers instead. Thanks.

## 2015-08-10 NOTE — Telephone Encounter (Signed)
Ok refill x 1 year 

## 2015-10-03 ENCOUNTER — Other Ambulatory Visit: Payer: Self-pay | Admitting: Internal Medicine

## 2015-10-12 ENCOUNTER — Other Ambulatory Visit: Payer: Self-pay | Admitting: Internal Medicine

## 2015-10-12 MED ORDER — PREDNISONE 20 MG PO TABS
ORAL_TABLET | ORAL | Status: DC
Start: 2015-10-12 — End: 2017-01-14

## 2015-10-12 NOTE — Telephone Encounter (Signed)
Rx approved by CY and sent electronically to [facility].

## 2015-12-25 ENCOUNTER — Other Ambulatory Visit: Payer: Self-pay | Admitting: Internal Medicine

## 2016-01-10 ENCOUNTER — Ambulatory Visit (INDEPENDENT_AMBULATORY_CARE_PROVIDER_SITE_OTHER): Payer: 59 | Admitting: Internal Medicine

## 2016-01-10 ENCOUNTER — Encounter: Payer: Self-pay | Admitting: Internal Medicine

## 2016-01-10 VITALS — BP 120/76 | HR 85 | Ht 63.0 in | Wt 187.6 lb

## 2016-01-10 DIAGNOSIS — J3089 Other allergic rhinitis: Secondary | ICD-10-CM

## 2016-01-10 DIAGNOSIS — J302 Other seasonal allergic rhinitis: Secondary | ICD-10-CM

## 2016-01-10 NOTE — Patient Instructions (Signed)
We can continue present meds  An antihistamine for post-nasal drip may help as needed- such as claritin, allegra, zyrtec  Please call if we can help

## 2016-01-10 NOTE — Progress Notes (Signed)
Patient ID: Andrea [name], female    DOB: 04/17/1951, 65 y.o.   MRN: [id number]  HPI   04/16/12- 89 yoF never smoker here for f/u of allergic rhinitis and asthma/ NSAIDs, with hx DVT/PE. Occasional flare up but overall has had a great summer. Had the vents in her home cleaned. Mild nasal congestion now with fall season. PFT: 10/30/2011-normal spirometry flows and volumes(FEV1/FVC 0.88) with insignificant response to bronchodilator. Diffusion mildly reduced 76%  10/15/12- 61 yoF never smoker here for f/u of allergic rhinitis and asthma/ NSAIDs, with hx DVT/PE. FOLLOWS FOR: when out side running errands she can tell allergies are high; starts coughing. Increased seasonal rhinitis symptoms and light cough if stays outside long now- tree pollens. Resumed flonase. We discussed dust mask for yard work. She is satisfied.  Continues Advair 500. Discussed cost and alternatives.   06/07/13- [age] yoF never smoker here for f/u of allergic rhinitis and asthma/ NSAIDs, with hx DVT/PE. FOLLOWS FOR: has been doing well overall; got through the ragweed season and had been exposed to some "cold/flu" had fever x 2 days; started prednisone and got better (this was around the first part of October) Now back to baseline with last prednisone 2 weeks ago.  12/09/13- [age] yoF never smoker here for f/u of allergic rhinitis and asthma/ NSAIDs, with hx DVT/PE. FOLLOWS FOR: Sinus pressure and PND, has improved some over past week Needed prednisone during flare of rhinitis in early Spring, but substantially improved now. Mostly sensitive to Spring trees, Fall colds. Discussed past experience w/ allergy vaccine and availability of oral immunotherapy for grass and ragweed. Not wheezing. Meds adequate.   07/11/14- [age] yoF never smoker here for f/u of allergic rhinitis and asthma/ NSAIDs, with hx DVT/PE. FOLLOWS FOR: pt states she has been using rescue inhalers some with weather changes.  no complaints at this time . She does  well except if she visits her daughter's home where there are chickens and paints, then she gets chest tightness. We discussed use of rescue inhaler. She is continued Advair and Spiriva until recently when insurance changed. Now needs replacement.  01/10/15- 63 yoF never smoker here for f/u of allergic rhinitis and asthma/ NSAIDs, with hx DVT/PE. Pt. states that she is able to control her allergies, denies SOB, wheezing and coughing Holding prednisone for taper if needed. Did this during the spring pollen season. We discussed steroid use and side effects again. Insurance prefers Incruse Ellipta over Spiriva  03/16/15- 63 yoF never smoker here for f/u of allergic rhinitis and asthma/ NSAIDs, with hx DVT/PE. ACUTE VISIT: ? Bronchitis; URI started 02-24-15-seen PCP office on 03-01-15-was given high dose Pred taper; returned the following week and could not get below 30 mg Pred without coughing and also had fever;03-07-15; Increased Pred to 60 mg tapering every 2 daysby 10 mg and neb tx BID. Pt states fever is gone and congestion is gone but continues to have pressure in ears and cough-deep. Dizzy spells as well. Peak flow went down to 350-has come back up to 420-425. Using Clartin OTC for PND. EOS WNl 02/25/14 IgE 13.9 10/25/10 Current exacerbation began as a definite upper respiratory infection/viral syndrome in the last week of July with fever. Primary physician treated with prednisone taper from 60 mg and on follow-up repeated the taper. She is dropping by 10 mg every 2 days and getting slowly better. Peak flows are improving. Chest remains tight. Pressure in ears. No headache or purulent nasal discharge. Mild postnasal drip.  Primary care did chest x-ray [facility] with report not yet available. Has her medications, discussed, including nebulizer machine.  07/12/2015-65 year old female never smoker followed for allergic rhinitis, asthma/NSAIDs, complicated by history DVT/PE FOLLOWS FOR: Pt has cough that happens  when she comes indoors from outdoors-usually goes away about 20 minutes. Pt has been off Incruse for about 3 weeks-out of flex spending to pay for med. Her typical cough varies with temperature changes, going in and out of doors. No acute event. Has run out of Incruse inhaler.  01/10/16- 65 year old female never smoker followed for allergic rhinitis, asthma/NSAIDs, complicated by history DVT/PE FOLLOWS FOR: Pt states she has started to have night time cough; otherwise doing well.     Review of Systems-see HPI Constitutional:   No weight loss, night sweats,  Fevers, chills, fatigue, lassitude. HEENT:   No headaches,  Difficulty swallowing,  Tooth/dental problems,  Sore throat,                No-sneezing, itching, ear ache, +nasal congestion, post nasal drip,  CV:  No chest pain,  Orthopnea, PND, swelling in lower extremities, anasarca, dizziness, palpitations GI  No heartburn, indigestion, abdominal pain, nausea, vomiting,  Resp: No shortness of breath at rest.  No excess mucus, no productive cough, + Non-productive cough,  No coughing up of blood.   change in color of mucus. +recent wheezing.   Skin: no rash or lesions. GU:  MS:  No joint pain . Psych:  No change in mood or affect. No depression or anxiety.  No memory loss.  Objective:   Physical Exam General- Alert, Oriented, Affect-appropriate, Distress- none acute, +overweight Skin- rash-none, lesions- none, excoriation- none; pale complexion Lymphadenopathy- none Head- atraumatic            Eyes- Gross vision intact, PERRLA, conjunctivae clear secretions            Ears- Hearing, canals normal            Nose- Clear, No-Septal dev, mucus, polyps, erosion, perforation             Throat- Mallampati II-III , mucosa clear , drainage- none, tonsils- atrophic Neck- flexible , trachea midline, no stridor , thyroid nl, carotid no bruit Chest - symmetrical excursion , unlabored           Heart/CV- RRR , no murmur , no gallop  , no rub, nl  s1 s2                           - JVD- none , edema- none, stasis changes- none, varices-            Lung-  wheeze-none  cough -none , dullness-none, rub- none           Chest wall-  Abd-  Br/ Gen/ Rectal- Not done, not indicated Extrem- cyanosis- none, clubbing, none, atrophy- none, strength- nl Neuro- grossly intact to observation

## 2016-01-22 ENCOUNTER — Other Ambulatory Visit: Payer: Self-pay | Admitting: Family Medicine

## 2016-01-22 DIAGNOSIS — Z139 Encounter for screening, unspecified: Secondary | ICD-10-CM

## 2016-01-30 ENCOUNTER — Ambulatory Visit
Admission: RE | Admit: 2016-01-30 | Discharge: 2016-01-30 | Disposition: A | Discharge status: Home / Self Care | Payer: 59 | Source: Ambulatory Visit | Attending: Family Medicine | Admitting: Family Medicine

## 2016-01-30 DIAGNOSIS — Z139 Encounter for screening, unspecified: Secondary | ICD-10-CM

## 2016-02-14 ENCOUNTER — Other Ambulatory Visit: Payer: Self-pay | Admitting: Internal Medicine

## 2016-04-02 ENCOUNTER — Other Ambulatory Visit: Payer: Self-pay | Admitting: Internal Medicine

## 2016-05-11 ENCOUNTER — Other Ambulatory Visit: Payer: Self-pay | Admitting: Internal Medicine

## 2016-06-24 ENCOUNTER — Other Ambulatory Visit: Payer: Self-pay | Admitting: Internal Medicine

## 2016-07-16 ENCOUNTER — Ambulatory Visit (INDEPENDENT_AMBULATORY_CARE_PROVIDER_SITE_OTHER): Payer: 59 | Admitting: Internal Medicine

## 2016-07-16 ENCOUNTER — Encounter: Payer: Self-pay | Admitting: Internal Medicine

## 2016-07-16 DIAGNOSIS — Z23 Encounter for immunization: Secondary | ICD-10-CM | POA: Diagnosis not present

## 2016-07-16 DIAGNOSIS — J4541 Moderate persistent asthma with (acute) exacerbation: Secondary | ICD-10-CM | POA: Diagnosis not present

## 2016-07-16 MED ORDER — PEAK FLOW METER DEVI: 0 refills | Status: AC

## 2016-07-16 NOTE — Progress Notes (Signed)
Patient ID: Andrea [name], female    DOB: May 16, 1951, 65 y.o.   MRN: [id number]  HPI female never smoker followed for allergic rhinitis, asthma/NSAIDS, complicated by history DVT/PE PFT: 10/30/2011-normal spirometry flows and volumes(FEV1/FVC 0.88) with insignificant response to bronchodilator. Diffusion mildly reduced 76 EOS WNl 02/25/14 IgE 13.9 10/25/10  -----------------------------------------------------------  07/12/2015-65 year old female never smoker followed for allergic rhinitis, asthma/NSAIDs, complicated by history DVT/PE FOLLOWS FOR: Pt has cough that happens when she comes indoors from outdoors-usually goes away about 20 minutes. Pt has been off Incruse for about 3 weeks-out of flex spending to pay for med. Her typical cough varies with temperature changes, going in and out of doors. No acute event. Has run out of Incruse inhaler.  01/10/16- 65 year old female never smoker followed for allergic rhinitis, asthma/NSAIDs, complicated by history DVT/PE FOLLOWS FOR: Pt states she has started to have night time cough; otherwise doing well.  07/16/2016-65 year old female never smoker followed for allergic rhinitis, asthma/NSAIDS, complicated by history DVT/PE FOLLOWS WUJ:[id number]:exposed to sickness ?possible flu 1 mth. ago fever 4 days,still coughing-clear,chest congestion,PND,still SOB with exertion now,no fcs Flulike illness now substantially resolved. Still some mild wheezy cough. Asks replacement for her broken peak flow meter. Used nebulizer machine during acute illness and current meds have been adequate. Off prednisone but does have some still at the house if needed.  Review of Systems-see HPI Constitutional:   No weight loss, night sweats,  Fevers, chills, fatigue, lassitude. HEENT:   No headaches,  Difficulty swallowing,  Tooth/dental problems,  Sore throat,                No-sneezing, itching, ear ache, +nasal congestion, post nasal drip,  CV:  No chest pain,  Orthopnea, PND,  swelling in lower extremities, anasarca, dizziness, palpitations GI  No heartburn, indigestion, abdominal pain, nausea, vomiting,  Resp: No shortness of breath at rest.  No excess mucus, no productive cough, + Non-productive cough,  No coughing up of blood.   change in color of mucus. +recent wheezing.   Skin: no rash or lesions. GU:  MS:  No joint pain . Psych:  No change in mood or affect. No depression or anxiety.  No memory loss.  Objective:   Physical Exam General- Alert, Oriented, Affect-appropriate, Distress- none acute, +overweight Skin- rash-none, lesions- none, excoriation- none; pale complexion Lymphadenopathy- none Head- atraumatic            Eyes- Gross vision intact, PERRLA, conjunctivae clear secretions            Ears- Hearing, canals normal            Nose- Clear, No-Septal dev, mucus, polyps, erosion, perforation             Throat- Mallampati II-III , mucosa clear , drainage- none, tonsils- atrophic Neck- flexible , trachea midline, no stridor , thyroid nl, carotid no bruit Chest - symmetrical excursion , unlabored           Heart/CV- RRR , no murmur , no gallop  , no rub, nl s1 s2                           - JVD- none , edema- none, stasis changes- none, varices-            Lung-  wheeze-none  Cough-+ cough with deep breath , dullness-none, rub- none           Chest wall-  Abd-  Br/  Gen/ Rectal- Not done, not indicated Extrem- cyanosis- none, clubbing, none, atrophy- none, strength- nl Neuro- grossly intact to observation

## 2016-07-16 NOTE — Assessment & Plan Note (Signed)
Substantially improved after recent exacerbation related to respiratory infection. We discussed medications and outlined the availability of "Biologics" like Xolair in the future if needed. Plan-replacement peak flow meter, flu vaccine

## 2016-07-16 NOTE — Patient Instructions (Signed)
We can continue current meds  Please call as needed  Flu vax standard

## 2016-08-05 ENCOUNTER — Other Ambulatory Visit: Payer: Self-pay

## 2016-08-05 MED ORDER — EPINEPHRINE 0.3 MG/0.3ML IJ SOAJ: 0.3000 mg | Freq: Once | INTRAMUSCULAR | 3 refills | Status: AC

## 2016-08-06 ENCOUNTER — Other Ambulatory Visit: Payer: Self-pay

## 2016-08-11 ENCOUNTER — Other Ambulatory Visit: Payer: Self-pay | Admitting: Internal Medicine

## 2016-08-28 ENCOUNTER — Telehealth: Payer: Self-pay | Admitting: Internal Medicine

## 2016-09-01 NOTE — Telephone Encounter (Signed)
Nothing in message. Will sign off.

## 2016-09-02 ENCOUNTER — Telehealth: Payer: Self-pay | Admitting: Internal Medicine

## 2016-09-02 NOTE — Telephone Encounter (Signed)
Paperwork in [place] folder has been placed on CY's cart.

## 2016-09-02 NOTE — Telephone Encounter (Signed)
Pt calling back to check on the status of forms, I let her know that forms are on CY cart to be signed and returned.Andrea GriffinsStanley A Dalton

## 2016-09-02 NOTE — Telephone Encounter (Signed)
FMLA form is complete- left up front.

## 2016-09-02 NOTE — Telephone Encounter (Signed)
CY  Pt. Called in checking on the status of her FMLA. I informed the pt. Andrea [name] sent the message to you earlier  and that we will contact her once you have the opportunity to look them over

## 2016-09-02 NOTE — Telephone Encounter (Signed)
Forms [facility]. I have verified with Rodell Pernaatrice that she has received these. Nothing further needed.

## 2016-09-03 NOTE — Telephone Encounter (Signed)
Rec'd FMLA forms back from CY - placed in interoffice mail to [employer] 09/03/2016 -pr

## 2016-09-12 ENCOUNTER — Telehealth: Payer: Self-pay | Admitting: Internal Medicine

## 2016-09-12 NOTE — Telephone Encounter (Signed)
Pt states that 2 sections need to be corrected on her FMLA forms recently filled out.  Part A : Probable duration of condition --- They do not like "ongoing" for this answer. They need answers such as "life-long" or "chronic" Part B : Frequency / Duration ---  This needs to be 1 time per 1 month ; 2 days per episode.  Answers for #6 and #8 in part B must match.   Patient is bringing forms in on 09/15/16, will hold in triage until FMLA forms are received.  Pt needs these faxed back by/before 09/19/16.

## 2016-09-17 NOTE — Telephone Encounter (Signed)
FMLA forms have not been received yet as of today.

## 2016-09-18 NOTE — Telephone Encounter (Signed)
As of today, FMLA forms have not been received as of today

## 2016-09-19 NOTE — Telephone Encounter (Signed)
lmtcb x1 for pt. Need to check on the status of this.

## 2016-09-22 NOTE — Telephone Encounter (Signed)
lmtcb X2 for pt.  

## 2016-09-24 NOTE — Telephone Encounter (Signed)
Spoke with pt she stated she brought the forms in on the 09/15/16 and received confirmation that the fax was received on 09/19/16. Pt is requesting the hard copy, I do not see the form in the office nor was the corrected copy scanned into her chart yet. Left a vm per Katie instructions to the scan department [phone]) to see if they have the corrected copy and if they can fax it to us. Will await there call back

## 2016-09-26 ENCOUNTER — Other Ambulatory Visit: Payer: Self-pay | Admitting: Internal Medicine

## 2016-09-26 NOTE — Telephone Encounter (Signed)
Spoke with Fredderick PhenixNeka and these FMLA forms have already been scanned in, they are in the Media tab.   Dr. Maple HudsonYoung please advise if you are with changing the forms and we can fax them back.

## 2016-09-26 NOTE — Telephone Encounter (Signed)
Neka from medical records returned call, [phone].

## 2016-09-26 NOTE — Telephone Encounter (Signed)
Ok

## 2016-09-30 NOTE — Telephone Encounter (Signed)
FMLA forms have been printed and placed on CY's cart.

## 2016-09-30 NOTE — Telephone Encounter (Signed)
Ok

## 2016-09-30 NOTE — Telephone Encounter (Signed)
Forms have been updated by CY. lmtcb x1 for pt to see if she wants to pick these up or have us fax them in for her.  *Forms are located in triage*

## 2016-10-01 NOTE — Telephone Encounter (Signed)
lmtcb x2 for pt. 

## 2016-10-02 NOTE — Telephone Encounter (Signed)
Since we have not heard back from the pt, I am going to go ahead and fax in the revised FMLA forms. If/when the pt calls back, we can make her aware of this information.

## 2016-10-11 ENCOUNTER — Other Ambulatory Visit: Payer: Self-pay | Admitting: Internal Medicine

## 2017-01-14 ENCOUNTER — Encounter: Payer: Self-pay | Admitting: Internal Medicine

## 2017-01-14 ENCOUNTER — Ambulatory Visit (INDEPENDENT_AMBULATORY_CARE_PROVIDER_SITE_OTHER): Payer: 59 | Admitting: Internal Medicine

## 2017-01-14 DIAGNOSIS — I2699 Other pulmonary embolism without acute cor pulmonale: Secondary | ICD-10-CM | POA: Diagnosis not present

## 2017-01-14 DIAGNOSIS — J4540 Moderate persistent asthma, uncomplicated: Secondary | ICD-10-CM

## 2017-01-14 MED ORDER — FLUTICASONE-UMECLIDIN-VILANT 100-62.5-25 MCG/INH IN AEPB: 1.0000 | INHALATION_SPRAY | Freq: Every day | RESPIRATORY_TRACT | 0 refills | Status: DC

## 2017-01-14 NOTE — Patient Instructions (Signed)
Sample x 2 Trelegy Ellipta inhaler      Inhale 1 puff then rinse mouth, once daily     Try this instead of Advair and Incruse. Go back to them for comparison when the samples run out.  No change in use of the other meds  Please call if we can help

## 2017-01-14 NOTE — Assessment & Plan Note (Signed)
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 No recurrent event. No longer on anticoagulants. She is careful to avoid stasis.

## 2017-01-14 NOTE — Progress Notes (Signed)
Patient ID: Andrea [name], female    DOB: 1951-02-25, 66 y.o.   MRN: [id number]  HPI female never smoker followed for allergic rhinitis, asthma/NSAIDS, complicated by history DVT/PE PFT: 10/30/2011-normal spirometry flows and volumes(FEV1/FVC 0.88) with insignificant response to bronchodilator. Diffusion mildly reduced 76 EOS WNl 02/25/14 IgE 13.9 10/25/10  -----------------------------------------------------------  01/10/16- 66 year old female never smoker followed for allergic rhinitis, asthma/NSAIDs, complicated by history DVT/PE FOLLOWS FOR: Pt states she has started to have night time cough; otherwise doing well.  07/16/2016-66 year old female never smoker followed for allergic rhinitis, asthma/NSAIDS, complicated by history DVT/PE FOLLOWS WUJ:[id number]:exposed to sickness ?possible flu 1 mth. ago fever 4 days,still coughing-clear,chest congestion,PND,still SOB with exertion now,no fcs Fl[place] illness now substantially resolved. Still some mild wheezy cough. Asks replacement for her broken peak flow meter. Used nebulizer machine during acute illness and current meds have been adequate. Off prednisone but does have some still at the house if needed.  01/14/17- 66 year old female never smoker followed for allergic rhinitis, asthma/NSAIDS, complicated by history DVT/PE 6 month follow up for asthma.  Doing very well now using Advair 500, InCruse, Singulair. Has not needed rescue inhaler in 3 or 4 weeks. Had a difficult time late fall and winter with a series of viral infections and protracted cough now completely resolved. Did need prednisone in May.  Review of Systems-see HPI    + = pos Constitutional:   No weight loss, night sweats,  Fevers, chills, fatigue, lassitude. HEENT:   No headaches,  Difficulty swallowing,  Tooth/dental problems,  Sore throat,                No-sneezing, itching, ear ache, +nasal congestion, post nasal drip,  CV:  No chest pain,  Orthopnea, PND, swelling in lower  extremities, anasarca, dizziness, palpitations GI  No heartburn, indigestion, abdominal pain, nausea, vomiting,  Resp: No shortness of breath at rest.  No excess mucus, no productive cough, + Non-productive cough,  No coughing up of blood.   change in color of mucus. recent wheezing.   Skin: no rash or lesions. GU:  MS:  No joint pain . Psych:  No change in mood or affect. No depression or anxiety.  No memory loss.  Objective:   Physical Exam General- Alert, Oriented, Affect-appropriate, Distress- none acute, +overweight Skin- rash-none, lesions- none, excoriation- none; pale complexion Lymphadenopathy- none Head- atraumatic            Eyes- Gross vision intact, PERRLA, conjunctivae clear secretions            Ears- Hearing, canals normal            Nose- Clear, No-Septal dev, mucus, polyps, erosion, perforation             Throat- Mallampati II-III , mucosa clear , drainage- none, tonsils- atrophic Neck- flexible , trachea midline, no stridor , thyroid nl, carotid no bruit Chest - symmetrical excursion , unlabored           Heart/CV- RRR , no murmur , no gallop  , no rub, nl s1 s2                           - JVD- none , edema- none, stasis changes- none, varices-            Lung-  wheeze-none  Cough-None , dullness-none, rub- none           Chest wall-  Abd-  Br/ Gen/ Rectal-  Not done, not indicated Extrem- cyanosis- none, clubbing, none, atrophy- none, strength- nl Neuro- grossly intact to observation

## 2017-01-14 NOTE — Assessment & Plan Note (Signed)
Has sustained asthmatic bronchitis problem through the winter but currently well, at baseline without complication for this visit. We discussed medication opportunities. Plan-try samples of Trelegy. When these are done go back to Advair and Incruse for comparison.

## 2017-01-16 ENCOUNTER — Telehealth: Payer: Self-pay | Admitting: Internal Medicine

## 2017-01-16 MED ORDER — PREDNISONE 20 MG PO TABS: 20.0000 mg | ORAL_TABLET | Freq: Every day | ORAL | 0 refills | Status: DC

## 2017-01-16 MED ORDER — FLUTICASONE-SALMETEROL 500-50 MCG/DOSE IN AEPB: INHALATION_SPRAY | RESPIRATORY_TRACT | 5 refills | Status: DC

## 2017-01-16 MED ORDER — UMECLIDINIUM BROMIDE 62.5 MCG/INH IN AEPB: 1.0000 | INHALATION_SPRAY | Freq: Every day | RESPIRATORY_TRACT | 10 refills | Status: DC

## 2017-01-16 NOTE — Telephone Encounter (Signed)
Spoke with patient. She said that since starting the Trelegy on Wednesday, she feels that her asthma has gotten worse. She has started coughing more (nonproductive cough), increase in SOB, and her peak flow values are down.   The only medication change that she made was stopping the Advair and Incruse.   She wants to know what to do about the symptoms.   [facility] [employer] [facility] [employer].   CY, please advise. Thanks.

## 2017-01-16 NOTE — Telephone Encounter (Signed)
Suggest she dc Trelegy (remove from med list) and resume her Advair and Incruse.   Ok to refill those inhalers if needed  Offer prednisone 20 mg, # 5, 1 daily, to hold. Start this if needed.

## 2017-01-16 NOTE — Telephone Encounter (Signed)
Spoke with patient with CY's recs. Will send in refills on Advair and Incruse. Prednisone has been sent in as well. Trelegy has been taken off of patient's medication list.

## 2017-04-09 ENCOUNTER — Other Ambulatory Visit: Payer: Self-pay | Admitting: Internal Medicine

## 2017-04-09 NOTE — Telephone Encounter (Signed)
Ok refill as before 

## 2017-04-09 NOTE — Telephone Encounter (Signed)
CY Please advise on refill as this is a short Rx for patient. Thanks.

## 2017-04-10 NOTE — Telephone Encounter (Signed)
Does patient need more than just this 20 daily x 5 days, or is there another question?

## 2017-05-11 ENCOUNTER — Other Ambulatory Visit: Payer: Self-pay | Admitting: Internal Medicine

## 2017-07-16 ENCOUNTER — Encounter: Payer: Self-pay | Admitting: Internal Medicine

## 2017-07-16 ENCOUNTER — Ambulatory Visit: Payer: 59 | Admitting: Internal Medicine

## 2017-07-16 DIAGNOSIS — J069 Acute upper respiratory infection, unspecified: Secondary | ICD-10-CM | POA: Diagnosis not present

## 2017-07-16 DIAGNOSIS — J302 Other seasonal allergic rhinitis: Secondary | ICD-10-CM | POA: Diagnosis not present

## 2017-07-16 DIAGNOSIS — J3089 Other allergic rhinitis: Secondary | ICD-10-CM

## 2017-07-16 DIAGNOSIS — Z23 Encounter for immunization: Secondary | ICD-10-CM

## 2017-07-16 DIAGNOSIS — J4540 Moderate persistent asthma, uncomplicated: Secondary | ICD-10-CM | POA: Diagnosis not present

## 2017-07-16 MED ORDER — ALBUTEROL SULFATE 0.63 MG/3ML IN NEBU: INHALATION_SOLUTION | RESPIRATORY_TRACT | 12 refills | Status: DC

## 2017-07-16 MED ORDER — FLUTICASONE PROPIONATE 50 MCG/ACT NA SUSP: NASAL | 12 refills | Status: DC

## 2017-07-16 MED ORDER — ALBUTEROL SULFATE HFA 108 (90 BASE) MCG/ACT IN AERS: INHALATION_SPRAY | RESPIRATORY_TRACT | 12 refills | Status: DC

## 2017-07-16 NOTE — Assessment & Plan Note (Signed)
Manages as she has learned works well for her-a day or 2 of prednisone, Mucinex, fluids.

## 2017-07-16 NOTE — Assessment & Plan Note (Signed)
Seasonal Flonase and antihistamine as needed have been providing good control.

## 2017-07-16 NOTE — Progress Notes (Signed)
Patient ID: Andrea [name], female    DOB: 05-20-1951, 66 y.o.   MRN: [id number]  HPI female never smoker followed for allergic rhinitis, asthma/NSAIDS, complicated by history DVT/PE PFT: 10/30/2011-normal spirometry flows and volumes(FEV1/FVC 0.88) with insignificant response to bronchodilator. Diffusion mildly reduced 76 EOS WNl 02/25/14 IgE 13.9 10/25/10  -----------------------------------------------------------  01/14/17- 66 year old female never smoker followed for allergic rhinitis, asthma/NSAIDS, complicated by history DVT/PE 6 month follow up for asthma.  Doing very well now using Advair 500, InCruse, Singulair. Has not needed rescue inhaler in 3 or 4 weeks. Had a difficult time late fall and winter with a series of viral infections and protracted cough now completely resolved. Did need prednisone in May.  07/16/17- 66 year old female never smoker followed for allergic rhinitis, asthma/NSAIDS, complicated by history DVT/PE Asthma: Pt has fought off several colds since last visit but feels back to normal now. Wants flu shot today. Exposed to grandchildren in crowds now-more frequent colds.  She takes a prednisone tablet or 2 and Mucinex at the outset, usually maintaining control without breakthrough wheezing.  Uses nebulizer occasionally, rescue inhaler 3 or 4 times a week and continues Advair 500 with Singulair and Incruse.  Usually feels well without sleep disturbance. Seasonal allergic rhinitis-uses Flonase.  Review of Systems-see HPI    + = pos Constitutional:   No weight loss, night sweats,  Fevers, chills, fatigue, lassitude. HEENT:   No headaches,  Difficulty swallowing,  Tooth/dental problems,  Sore throat,                No-sneezing, itching, ear ache, +nasal congestion, post nasal drip,  CV:  No chest pain,  Orthopnea, PND, swelling in lower extremities, anasarca, dizziness, palpitations GI  No heartburn, indigestion, abdominal pain, nausea, vomiting,  Resp: No  shortness of breath at rest.  No excess mucus, no productive cough, + Non-productive cough,  No coughing up of blood.   change in color of mucus. recent wheezing.   Skin: no rash or lesions. GU:  MS:  No joint pain . Psych:  No change in mood or affect. No depression or anxiety.  No memory loss.  Objective:   Physical Exam General- Alert, Oriented, Affect-appropriate, Distress- none acute, +overweight Skin- rash-none, lesions- none, excoriation- none; pale complexion Lymphadenopathy- none Head- atraumatic            Eyes- Gross vision intact, PERRLA, conjunctivae clear secretions            Ears- Hearing, canals normal            Nose- Clear, No-Septal dev, mucus, polyps, erosion, perforation             Throat- Mallampati II-III , mucosa clear , drainage- none, tonsils- atrophic Neck- flexible , trachea midline, no stridor , thyroid nl, carotid no bruit Chest - symmetrical excursion , unlabored           Heart/CV- RRR , no murmur , no gallop  , no rub, nl s1 s2                           - JVD- none , edema- none, stasis changes- none, varices-            Lung-  wheeze-none  Cough-None , dullness-none, rub- none           Chest wall-  Abd-  Br/ Gen/ Rectal- Not done, not indicated Extrem- cyanosis- none, clubbing, none, atrophy- none, strength-  nl Neuro- grossly intact to observation

## 2017-07-16 NOTE — Assessment & Plan Note (Signed)
Currently uncomplicated.  We discussed impact of more frequent colds.  Control is satisfactory.

## 2017-07-16 NOTE — Patient Instructions (Signed)
Refills sent  Flu vax- senior  Order- Prevnar-13 pneumonia vaccine- this is a one-time shot. You can call to come back and get it anytime after 2 weeks from today's flu shot

## 2017-08-06 ENCOUNTER — Ambulatory Visit (INDEPENDENT_AMBULATORY_CARE_PROVIDER_SITE_OTHER): Payer: 59

## 2017-08-06 DIAGNOSIS — Z23 Encounter for immunization: Secondary | ICD-10-CM

## 2017-08-11 DIAGNOSIS — Z23 Encounter for immunization: Secondary | ICD-10-CM | POA: Diagnosis not present

## 2017-08-11 NOTE — Progress Notes (Signed)
Documentation of medication administration and charges of Prevnar 13 have been completed by Jaynee EaglesLindsay Jarry Manon, CMA based on the Prevnar 13 documentation sheet completed by Dimas Millinammy Scott.

## 2017-08-13 ENCOUNTER — Telehealth: Payer: Self-pay | Admitting: Internal Medicine

## 2017-08-13 ENCOUNTER — Encounter: Payer: Self-pay | Admitting: Internal Medicine

## 2017-08-13 NOTE — Telephone Encounter (Signed)
Sorry this happened- record it as an allergic reaction to the particular pneumonia vaccine- pneumovax or prevnar, that she got.  She could try an otc topical steroid cream on the remaining rash for 3-4 days.

## 2017-08-13 NOTE — Telephone Encounter (Signed)
Pt received Pneumovax injection on 1/3, states that by 1/4 she had a red, hot swollen area at injection site approx the size of her palm X2 days.  Redness/swelling is now mostly resolved, but notes that she now has a red, "crescent shaped" non-raised mark around injection site.  Notes local itching.  Noted low grade temp on Friday.  Denies any side effects aside from local reaction.  No difficulty raising/moving arm. Pt has been using antihistamine cream at site to help with itching.  Pt states her s/s are manageable but wants to make CY aware.  I advised pt we would contact her if CY has any additional recs for her.  CY please advise.  Thanks .

## 2017-08-13 NOTE — Telephone Encounter (Signed)
Spoke with pt, aware of recs.  Allergy added to pt's chart.  Nothing further needed.

## 2017-09-18 ENCOUNTER — Other Ambulatory Visit: Payer: Self-pay | Admitting: Internal Medicine

## 2017-10-05 ENCOUNTER — Telehealth: Payer: Self-pay | Admitting: Internal Medicine

## 2017-10-05 NOTE — Telephone Encounter (Signed)
Rec'd completed forms - fwd to [employer] via interoffice mail -pr  °

## 2017-10-05 NOTE — Telephone Encounter (Signed)
Forms completed and signed by CY. [facility].

## 2017-10-05 NOTE — Telephone Encounter (Signed)
FMLA packet has been placed on CY's cart to complete.

## 2017-10-19 ENCOUNTER — Telehealth: Payer: Self-pay | Admitting: Internal Medicine

## 2017-10-19 NOTE — Telephone Encounter (Signed)
Does she want to return the form?

## 2017-10-19 NOTE — Telephone Encounter (Signed)
Patient will be bringing papers by in the morning to be fixed, she states that it can be fixed on the paperwork and initialed. Will route to Katie to follow up on.

## 2017-10-19 NOTE — Telephone Encounter (Signed)
Called and spoke with patient she states that her FLMA was filled out wrong and is needing her answer on box 8 to state once a month instead of 1 time per 6 months.    CY can you please advise on this, FMLA has already been completed.

## 2017-10-20 NOTE — Telephone Encounter (Signed)
Form is in CY's look at bin; will complete and return to us.

## 2017-10-20 NOTE — Telephone Encounter (Signed)
I got the forms and placed them on Katie's desk and will forward to her to f/u on, thanks

## 2017-10-20 NOTE — Telephone Encounter (Signed)
PT dropped off FMLA paperwork. It is in Dr. Roxy CedarYoung's folder. York SpanielSaid it can be faxed and cover sheet is attached to what needs to be fixed. Pt CB [phone] Will return later this week to pick up the original papers..If these abnormal clinical findings persist, appropriate workup will be completed. The patient understands that follow up is required to elucidate the situation. We will let her know when we send them off.

## 2017-10-21 NOTE — Telephone Encounter (Signed)
[facility] and faxed to the number listed on fax sheet. I called pt to let her know and left a copy up front for her to pick up. Nothing further is needed.

## 2017-12-18 ENCOUNTER — Other Ambulatory Visit: Payer: Self-pay | Admitting: Internal Medicine

## 2018-01-14 ENCOUNTER — Ambulatory Visit: Payer: 59 | Admitting: Internal Medicine

## 2018-01-14 ENCOUNTER — Encounter: Payer: Self-pay | Admitting: Internal Medicine

## 2018-01-14 VITALS — BP 132/84 | HR 89 | Ht 63.5 in | Wt 190.0 lb

## 2018-01-14 DIAGNOSIS — J4520 Mild intermittent asthma, uncomplicated: Secondary | ICD-10-CM | POA: Diagnosis not present

## 2018-01-14 DIAGNOSIS — J4540 Moderate persistent asthma, uncomplicated: Secondary | ICD-10-CM

## 2018-01-14 DIAGNOSIS — IMO0001 Reserved for inherently not codable concepts without codable children: Secondary | ICD-10-CM

## 2018-01-14 NOTE — Patient Instructions (Signed)
Order- lab- CBC w diff, IgE     Dx asthma moderate intermittent   We will look at whether one of the newer "biologic" injectable therapies could be an option.   Ok to continue present meds. Please call if we can help.

## 2018-01-14 NOTE — Assessment & Plan Note (Signed)
Sustained exacerbation was clearly linked to spring pollen season and she is now back to baseline feeling well. Plan-IgE, CBC with differential for eosinophil count, consider if biologic would be helpful.

## 2018-01-14 NOTE — Progress Notes (Signed)
Patient ID: Andrea [name], female    DOB: Nov 24, 1950, 67 y.o.   MRN: [id number]  HPI female never smoker followed for allergic rhinitis, asthma/NSAIDS, complicated by history DVT/PE PFT: 10/30/2011-normal spirometry flows and volumes(FEV1/FVC 0.88) with insignificant response to bronchodilator. Diffusion mildly reduced 76 EOS WNl 02/25/14 IgE 13.9 10/25/10  -----------------------------------------------------------  07/16/17- 67 year old female never smoker followed for allergic rhinitis, asthma/NSAIDS, complicated by history DVT/PE Asthma: Pt has fought off several colds since last visit but feels back to normal now. Wants flu shot today. Exposed to grandchildren in crowds now-more frequent colds.  She takes a prednisone tablet or 2 and Mucinex at the outset, usually maintaining control without breakthrough wheezing.  Uses nebulizer occasionally, rescue inhaler 3 or 4 times a week and continues Advair 500 with Singulair and Incruse.  Usually feels well without sleep disturbance. Seasonal allergic rhinitis-uses Flonase.  01/14/2018- 67 year old female never smoker followed for allergic rhinitis, asthma/NSAIDS, complicated by history DVT/PE -----Asthma: Pt states Spring was rough-cough, wheezing and SOB-had to use nebulizer, prednsione, and inhalers. Starting to feel better in the past 3 weeks.  Prednisone 20 mg to hold, Singulair, Advair 500, Ventolin HFA, neb albuterol, Incruse,  Had a number of colds during the winter.  Then in March with onset of pollen season began having rhinitis, conjunctivitis, increased wheeze and cough.  Needed to use her prednisone briefly, with last dose in early April.  Discussed allergy vaccine-she feels she can control exposure well enough does not want to start something.  Discussed availability of Biologics and we can check candidacy with labs.  Review of Systems-see HPI    + = positive Constitutional:   No weight loss, night sweats,  Fevers, chills,  fatigue, lassitude. HEENT:   No headaches,  Difficulty swallowing,  Tooth/dental problems,  Sore throat,                No-sneezing, itching, ear ache, +nasal congestion, post nasal drip,  CV:  No chest pain,  Orthopnea, PND, swelling in lower extremities, anasarca, dizziness, palpitations GI  No heartburn, indigestion, abdominal pain, nausea, vomiting,  Resp:  No shortness of breath at rest.  No excess mucus, no productive cough, + Non-productive cough,  No coughing up of blood.   change in color of mucus. recent wheezing.   Skin: no rash or lesions. GU:  MS:  No joint pain . Psych:  No change in mood or affect. No depression or anxiety.  No memory loss.  Objective:   Physical Exam General- Alert, Oriented, Affect-appropriate, Distress- none acute, +obese Skin- rash-none, lesions- none, excoriation- none; pale complexion Lymphadenopathy- none Head- atraumatic            Eyes- Gross vision intact, PERRLA, conjunctivae clear secretions            Ears- Hearing, canals normal            Nose- Clear, No-Septal dev, mucus, polyps, erosion, perforation             Throat- Mallampati II-III , mucosa clear , drainage- none, tonsils- atrophic Neck- flexible , trachea midline, no stridor , thyroid nl, carotid no bruit Chest - symmetrical excursion , unlabored           Heart/CV- RRR , no murmur , no gallop  , no rub, nl s1 s2                           - JVD- none ,  edema- none, stasis changes- none, varices-            Lung-  Clear, wheeze-none  Cough-None , dullness-none, rub- none           Chest wall-  Abd-  Br/ Gen/ Rectal- Not done, not indicated Extrem- cyanosis- none, clubbing, none, atrophy- none, strength- nl Neuro- grossly intact to observation

## 2018-01-16 ENCOUNTER — Other Ambulatory Visit: Payer: Self-pay | Admitting: Internal Medicine

## 2018-03-21 ENCOUNTER — Other Ambulatory Visit: Payer: Self-pay | Admitting: Internal Medicine

## 2018-03-24 ENCOUNTER — Encounter: Payer: Self-pay | Admitting: *Deleted

## 2018-03-24 ENCOUNTER — Telehealth: Payer: Self-pay | Admitting: *Deleted

## 2018-03-24 NOTE — Telephone Encounter (Signed)
Referral Request from [facility] [employer] sent to scheduling P:[phone] F#: [phone]  

## 2018-07-03 ENCOUNTER — Other Ambulatory Visit: Payer: Self-pay | Admitting: Internal Medicine

## 2018-07-21 ENCOUNTER — Telehealth: Payer: Self-pay | Admitting: Internal Medicine

## 2018-07-21 ENCOUNTER — Ambulatory Visit: Payer: 59 | Admitting: Internal Medicine

## 2018-07-21 ENCOUNTER — Encounter: Payer: Self-pay | Admitting: Internal Medicine

## 2018-07-21 VITALS — BP 138/82 | HR 78 | Ht 63.5 in | Wt 193.6 lb

## 2018-07-21 DIAGNOSIS — J4541 Moderate persistent asthma with (acute) exacerbation: Secondary | ICD-10-CM

## 2018-07-21 DIAGNOSIS — M810 Age-related osteoporosis without current pathological fracture: Secondary | ICD-10-CM | POA: Diagnosis not present

## 2018-07-21 DIAGNOSIS — IMO0001 Reserved for inherently not codable concepts without codable children: Secondary | ICD-10-CM

## 2018-07-21 DIAGNOSIS — J4520 Mild intermittent asthma, uncomplicated: Secondary | ICD-10-CM

## 2018-07-21 LAB — CBC WITH DIFFERENTIAL/PLATELET
BASOS PCT: 1 % (ref 0.0–3.0)
Basophils Absolute: 0.1 10*3/uL (ref 0.0–0.1)
Eosinophils Absolute: 0.2 10*3/uL (ref 0.0–0.7)
Eosinophils Relative: 3.1 % (ref 0.0–5.0)
HCT: 37.3 % (ref 36.0–46.0)
Hemoglobin: 12.5 g/dL (ref 12.0–15.0)
LYMPHS ABS: 1.1 10*3/uL (ref 0.7–4.0)
Lymphocytes Relative: 22.6 % (ref 12.0–46.0)
MCHC: 33.6 g/dL (ref 30.0–36.0)
MCV: 90.3 fl (ref 78.0–100.0)
MONO ABS: 0.4 10*3/uL (ref 0.1–1.0)
Monocytes Relative: 7.7 % (ref 3.0–12.0)
NEUTROS ABS: 3.2 10*3/uL (ref 1.4–7.7)
NEUTROS PCT: 65.6 % (ref 43.0–77.0)
Platelets: 240 10*3/uL (ref 150.0–400.0)
RBC: 4.14 Mil/uL (ref 3.87–5.11)
RDW: 13.2 % (ref 11.5–15.5)
WBC: 5 10*3/uL (ref 4.0–10.5)

## 2018-07-21 MED ORDER — PREDNISONE 5 MG PO TABS: 5.0000 mg | ORAL_TABLET | Freq: Every day | ORAL | 0 refills | Status: DC

## 2018-07-21 NOTE — Telephone Encounter (Signed)
Rx has been sent to pharmacy and nothing more needed at this time.

## 2018-07-21 NOTE — Telephone Encounter (Signed)
Suggest prednisone 5 mg, # 50   1 or 2 daily to help dosing, as directed

## 2018-07-21 NOTE — Telephone Encounter (Signed)
Please advise of a day and time patient can be seen. Patient had OV today and was supposed to come back in 6 weeks but nothing available. Thanks.

## 2018-07-21 NOTE — Progress Notes (Signed)
Patient ID: Andrea [name], female    DOB: 11-15-1950, 67 y.o.   MRN: [id number]  HPI female never smoker followed for allergic rhinitis, asthma/NSAIDS, complicated by history DVT/PE PFT: 10/30/2011-normal spirometry flows and volumes(FEV1/FVC 0.88) with insignificant response to bronchodilator. Diffusion mildly reduced 76 EOS WNl 02/25/14 IgE 13.9 10/25/10  -----------------------------------------------------------  01/14/2018- 67 year old female never smoker followed for allergic rhinitis, asthma/NSAIDS, complicated by history DVT/PE -----Asthma: Pt states Spring was rough-cough, wheezing and SOB-had to use nebulizer, prednsione, and inhalers. Starting to feel better in the past 3 weeks.  Prednisone 20 mg to hold, Singulair, Advair 500, Ventolin HFA, neb albuterol, Incruse,  Had a number of colds during the winter.  Then in March with onset of pollen season began having rhinitis, conjunctivitis, increased wheeze and cough.  Needed to use her prednisone briefly, with last dose in early April.  Discussed allergy vaccine-she feels she can control exposure well enough does not want to start something.  Discussed availability of Biologics and we can check candidacy with labs.  07/21/2018- 67 year old female never smoker followed for allergic rhinitis, asthma/NSAIDS, complicated by history DVT/PE, allergy to pneumonia vaccine -----Has had more sob and more coughing attacks at night.  Peak flow trending down. Ventolin HFA, Advair 500, Incruse, Singulair She thinks part of her asthma problem is stress-sister is sick with ovarian cancer and there are a lot of job layoffs at her workplace.  She took her own prednisone last in mid November No recent acute infection.  Review of Systems-see HPI    + = positive Constitutional:   No weight loss, night sweats,  Fevers, chills, fatigue, lassitude. HEENT:   No headaches,  Difficulty swallowing,  Tooth/dental problems,  Sore throat,   No-sneezing, itching, ear ache, +nasal congestion, post nasal drip,  CV:  No chest pain,  Orthopnea, PND, swelling in lower extremities, anasarca, dizziness, palpitations GI  No heartburn, indigestion, abdominal pain, nausea, vomiting,  Resp: + shortness of breath at rest.  No excess mucus, no productive cough, + Non-productive cough,  No coughing up of blood.   change in color of mucus. +recent wheezing.   Skin: no rash or lesions. GU:  MS:  No joint pain . Psych:  No change in mood or affect. No depression or anxiety.  No memory loss.  Objective:   Physical Exam General- Alert, Oriented, Affect-appropriate, Distress- none acute, +obese Skin- rash-none, lesions- none, excoriation- none; pale complexion Lymphadenopathy- none Head- atraumatic            Eyes- Gross vision intact, PERRLA, conjunctivae clear secretions            Ears- Hearing, canals normal            Nose- Clear, No-Septal dev, mucus, polyps, erosion, perforation             Throat- Mallampati II-III , mucosa clear , drainage- none, tonsils- atrophic Neck- flexible , trachea midline, no stridor , thyroid nl, carotid no bruit Chest - symmetrical excursion , unlabored           Heart/CV- RRR , no murmur , no gallop  , no rub, nl s1 s2                           - JVD- none , edema- none, stasis changes- none, varices-            Lung-  Clear, wheeze-none  Cough+ dry , dullness-none, rub- none  Chest wall-  Abd-  Br/ Gen/ Rectal- Not done, not indicated Extrem- cyanosis- none, clubbing, none, atrophy- none, strength- nl Neuro- grossly intact to observation

## 2018-07-21 NOTE — Telephone Encounter (Signed)
Katie- please get her in with me in 6-8 weeks. Thanks

## 2018-07-21 NOTE — Patient Instructions (Signed)
Order- complete previously ordered CBC w diff and total IgE    Dx asthma exacerbation  As discussed- put yourself back on 10 mg prednisone daily if you get any worse, dropping down to alternating 10 with 5 mg every other day and then quitting when you are able  I am looking at some alternative treatment strategy for you  Sample x 2 Trelegy     Inhale 1 puff, then rinse mouth, once daily   Try this instead of Advair and Incruse  Please cal if we can help

## 2018-07-21 NOTE — Telephone Encounter (Signed)
Pt states she has pred 20mg  and can split in half for 10mg  dose but will [facility] [employer] for Pred 5mg  doses.    Pt has been scheduled for 09/07/18 at 11:30am for 6-8 week follow up.

## 2018-07-22 LAB — IGE: IGE (IMMUNOGLOBULIN E), SERUM: 14 kU/L (ref <=114)

## 2018-07-23 ENCOUNTER — Telehealth: Payer: Self-pay | Admitting: Internal Medicine

## 2018-07-23 NOTE — Telephone Encounter (Signed)
Called and spoke with patient, she was returning a phone call regarding results. Results given to the patient, patient verbalized understanding. Nothing further needed.

## 2018-07-26 ENCOUNTER — Telehealth: Payer: Self-pay | Admitting: Internal Medicine

## 2018-07-26 MED ORDER — ALBUTEROL SULFATE 0.63 MG/3ML IN NEBU: INHALATION_SOLUTION | RESPIRATORY_TRACT | 12 refills | Status: DC

## 2018-07-26 NOTE — Telephone Encounter (Signed)
Called and spoke with patient, she stated that she is needing a refill of albuterol nebulizer sent to her pharmacy. Refill sent. Nothing further needed.

## 2018-08-04 NOTE — Assessment & Plan Note (Signed)
She understands the concerns associated with systemic steroid use.

## 2018-08-04 NOTE — Assessment & Plan Note (Signed)
Recent exacerbation may reflect stress as she suspects, and weather changes.  No infection. We discussed how to go back on a short prednisone taper if needed headed into the holiday season.  We discussed the possibility that she might benefit from 1 of the Biologics now available for asthma. Plan- sample trial of Trelegy instead of Advair 500. Labs for eosinophils and IgE, considering candidacy for a biologic. Outlined prednisone taper if needed.

## 2018-08-17 ENCOUNTER — Telehealth: Payer: Self-pay | Admitting: Internal Medicine

## 2018-08-17 MED ORDER — FLUTICASONE-UMECLIDIN-VILANT 100-62.5-25 MCG/INH IN AEPB: 1.0000 | INHALATION_SPRAY | Freq: Every day | RESPIRATORY_TRACT | 0 refills | Status: DC

## 2018-08-17 NOTE — Telephone Encounter (Signed)
Spoke with pt, advised her that I would leave Trelegy samples up front to pick up. Pt understand.   Patient Instructions by Waymon Budge, MD at 07/21/2018 9:00 AM  Author: Waymon Budge, MD Author Type: Physician Filed: 07/21/2018 9:37 AM  Note Status: Signed Cosign: Cosign Not Required Encounter Date: 07/21/2018  Editor: Waymon Budge, MD (Physician)    Order- complete previously ordered CBC w diff and total IgE    Dx asthma exacerbation  As discussed- put yourself back on 10 mg prednisone daily if you get any worse, dropping down to alternating 10 with 5 mg every other day and then quitting when you are able  I am looking at some alternative treatment strategy for you  Sample x 2 Trelegy     Inhale 1 puff, then rinse mouth, once daily   Try this instead of Advair and Incruse  Please cal if we can help

## 2018-08-25 ENCOUNTER — Other Ambulatory Visit: Payer: Self-pay | Admitting: Internal Medicine

## 2018-09-07 ENCOUNTER — Ambulatory Visit: Payer: 59 | Admitting: Internal Medicine

## 2018-09-07 ENCOUNTER — Encounter: Payer: Self-pay | Admitting: Internal Medicine

## 2018-09-07 DIAGNOSIS — J4540 Moderate persistent asthma, uncomplicated: Secondary | ICD-10-CM

## 2018-09-07 DIAGNOSIS — J069 Acute upper respiratory infection, unspecified: Secondary | ICD-10-CM | POA: Diagnosis not present

## 2018-09-07 MED ORDER — FLUTICASONE-SALMETEROL 500-50 MCG/DOSE IN AEPB: INHALATION_SPRAY | RESPIRATORY_TRACT | 12 refills | Status: DC

## 2018-09-07 MED ORDER — UMECLIDINIUM BROMIDE 62.5 MCG/INH IN AEPB: 1.0000 | INHALATION_SPRAY | Freq: Every day | RESPIRATORY_TRACT | 12 refills | Status: DC

## 2018-09-07 NOTE — Assessment & Plan Note (Signed)
Currently at baseline, having resolved most recent exacerbation.  Low eosinophil and total IgE levels make benefit from "biologic" meds unlikely.  I suspect she likes Advair 500 mainly because of the high steroid effect, compared with Trelegy.  We may want to retry Trelegy later but for now she wants to stay with what she is used to. Plan-refill Advair 500 and Incruse

## 2018-09-07 NOTE — Assessment & Plan Note (Signed)
2 exacerbations this winter have fit a pattern of viral respiratory infection.  She understands to avoid allergens and to sick people, but has some exposure to grandchildren.

## 2018-09-07 NOTE — Patient Instructions (Signed)
Ok to return to Advair and Incruse if that seems better than Trelegy- script refills sent.  Please call if we can help

## 2018-09-07 NOTE — Progress Notes (Signed)
Patient ID: Andrea [name], female    DOB: April 29, 1951, 68 y.o.   MRN: [id number]  HPI female never smoker followed for allergic rhinitis, asthma/NSAIDS, complicated by history DVT/PE PFT: 10/30/2011-normal spirometry flows and volumes(FEV1/FVC 0.88) with insignificant response to bronchodilator. Diffusion mildly reduced 76 EOS WNl 02/25/14 IgE 13.9 10/25/10 Lab 07/21/2018- EOS 0.2 WNL, Total IgE 14 WNL ----------------------------------------------------------- 07/21/2018- 68 year old female never smoker followed for allergic rhinitis, asthma/NSAIDS, complicated by history DVT/PE, allergy to pneumonia vaccine -----Has had more sob and more coughing attacks at night.  Peak flow trending down. Ventolin HFA, Advair 500, Incruse, Singulair She thinks part of her asthma problem is stress-sister is sick with ovarian cancer and there are a lot of job layoffs at her workplace.  She took her own prednisone last in mid November No recent acute infection.  09/07/2018- 68 year old female never smoker followed for allergic rhinitis, asthma/NSAIDS, complicated by history DVT/PE, allergy to pneumonia vaccine -----no cough - no asthma sx currently - using trelegy Albuterol neb, albuterol HFA, Flonase, Advair 500/ Incruse or Trelegy, Singulair She had a sustained "cold" in the first half of January and started Trelegy after that.  Has been using Trelegy about 3 weeks.  About a week and a half ago she thought she was getting another cold and took 2 doses of prednisone cleared quickly.  She felt her previous Advair 500/Incruse regimen would have controlled this second episode better.  We discussed expectations and viral season but she would prefer to go back to Advair/Incruse. Lab 07/21/2018- EOS 0.2 WNL, Total IgE 14 WNL  Review of Systems-see HPI    + = positive Constitutional:   No weight loss, night sweats,  Fevers, chills, fatigue, lassitude. HEENT:   No headaches,  Difficulty swallowing,  Tooth/dental  problems,  Sore throat,                No-sneezing, itching, ear ache, +nasal congestion, post nasal drip,  CV:  No chest pain,  Orthopnea, PND, swelling in lower extremities, anasarca, dizziness, palpitations GI  No heartburn, indigestion, abdominal pain, nausea, vomiting,  Resp: + shortness of breath at rest.  No excess mucus, no productive cough, + Non-productive cough,  No coughing up of blood.   change in color of mucus. +recent wheezing.   Skin: no rash or lesions. GU:  MS:  No joint pain . Psych:  No change in mood or affect. No depression or anxiety.  No memory loss.  Objective:   Physical Exam General- Alert, Oriented, Affect-appropriate, Distress- none acute, +obese Skin- rash-none, lesions- none, excoriation- none; pale complexion Lymphadenopathy- none Head- atraumatic            Eyes- Gross vision intact, PERRLA, conjunctivae clear secretions            Ears- Hearing, canals normal            Nose- Clear, No-Septal dev, mucus, polyps, erosion, perforation             Throat- Mallampati II-III , mucosa clear , drainage- none, tonsils- atrophic Neck- flexible , trachea midline, no stridor , thyroid nl, carotid no bruit Chest - symmetrical excursion , unlabored           Heart/CV- RRR , no murmur , no gallop  , no rub, nl s1 s2                           - JVD- none , edema- none,  stasis changes- none, varices-            Lung-  Clear, wheeze-none  Cough- none , dullness-none, rub- none           Chest wall-  Abd-  Br/ Gen/ Rectal- Not done, not indicated Extrem- cyanosis- none, clubbing, none, atrophy- none, strength- nl Neuro- grossly intact to observation

## 2018-09-21 ENCOUNTER — Other Ambulatory Visit: Payer: Self-pay | Admitting: Internal Medicine

## 2018-11-25 ENCOUNTER — Other Ambulatory Visit: Payer: Self-pay | Admitting: *Deleted

## 2018-11-25 MED ORDER — PREDNISONE 20 MG PO TABS: ORAL_TABLET | ORAL | 5 refills | Status: DC

## 2018-12-25 ENCOUNTER — Other Ambulatory Visit: Payer: Self-pay | Admitting: Internal Medicine

## 2019-01-10 ENCOUNTER — Telehealth: Payer: Self-pay | Admitting: Internal Medicine

## 2019-01-10 NOTE — Telephone Encounter (Signed)
Received FMLA forms for this pt from nursing supervisor. Will make CY aware and return to [facility] once forms have been signed.

## 2019-01-12 NOTE — Telephone Encounter (Signed)
Forms have been completed and signed by CY. Will return them to [facility] per FMLA protocol. Nothing further needed at this time.

## 2019-01-12 NOTE — Telephone Encounter (Signed)
Please advise if these forms have been completed. Thanks. 

## 2019-01-13 NOTE — Telephone Encounter (Signed)
Rec'd FMLA forms back from [place] to [place] via [employer] -pr

## 2019-03-08 ENCOUNTER — Ambulatory Visit (INDEPENDENT_AMBULATORY_CARE_PROVIDER_SITE_OTHER): Payer: 59 | Admitting: Internal Medicine

## 2019-03-08 ENCOUNTER — Other Ambulatory Visit: Payer: Self-pay

## 2019-03-08 ENCOUNTER — Encounter: Payer: Self-pay | Admitting: Internal Medicine

## 2019-03-08 DIAGNOSIS — J302 Other seasonal allergic rhinitis: Secondary | ICD-10-CM

## 2019-03-08 DIAGNOSIS — J3089 Other allergic rhinitis: Secondary | ICD-10-CM

## 2019-03-08 DIAGNOSIS — J4540 Moderate persistent asthma, uncomplicated: Secondary | ICD-10-CM

## 2019-03-08 NOTE — Progress Notes (Signed)
Patient ID: Andrea [name], female    DOB: 1951/05/03, 68 y.o.   MRN: [id number]  HPI female never smoker followed for allergic rhinitis, asthma/NSAIDS, complicated by history DVT/PE PFT: 10/30/2011-normal spirometry flows and volumes(FEV1/FVC 0.88) with insignificant response to bronchodilator. Diffusion mildly reduced 76 EOS WNl 02/25/14 IgE 13.9 10/25/10 Lab 07/21/2018- EOS 0.2 WNL, Total IgE 14 WNL -----------------------------------------------------------  09/07/2018- 68 year old female never smoker followed for allergic rhinitis, asthma/NSAIDS, complicated by history DVT/PE, allergy to pneumonia vaccine -----no cough - no asthma sx currently - using trelegy Albuterol neb, albuterol HFA, Flonase, Advair 500/ Incruse or Trelegy, Singulair She had a sustained "cold" in the first half of January and started Trelegy after that.  Has been using Trelegy about 3 weeks.  About a week and a half ago she thought she was getting another cold and took 2 doses of prednisone cleared quickly.  She felt her previous Advair 500/Incruse regimen would have controlled this second episode better.  We discussed expectations and viral season but she would prefer to go back to Advair/Incruse. Lab 07/21/2018- EOS 0.2 WNL, Total IgE 14 WNL  03/08/2019- 68 year old female never smoker followed for allergic rhinitis, asthma/NSAIDS, Urticaria, complicated by history DVT/PE, allergy to pneumonia vaccine -----pt reports a few "colds" since last office visit, pt also states she's had problems with breathing d/t seasonal changes; pt is currently taking Advair 500 and Incruse maintenance inhalers, albuterol rescue inhaler and albuterol neb prn, Singulair Covid careful Wants to wait on the expense of Biologic asthma therapies- discussed. She feels she is controlling her asthma with current meds.  Lab 07/21/18- IgE  14, Eos-  0.2K wnl  Review of Systems-see HPI    + = positive Constitutional:   No weight loss, night  sweats,  Fevers, chills, fatigue, lassitude. HEENT:   No headaches,  Difficulty swallowing,  Tooth/dental problems,  Sore throat,                No-sneezing, itching, ear ache, +nasal congestion, post nasal drip,  CV:  No chest pain,  Orthopnea, PND, swelling in lower extremities, anasarca, dizziness, palpitations GI  No heartburn, indigestion, abdominal pain, nausea, vomiting,  Resp: + shortness of breath at rest.  No excess mucus, no productive cough, + Non-productive cough,  No coughing up of blood.   change in color of mucus. +recent wheezing.   Skin: no rash or lesions. GU:  MS:  No joint pain . Psych:  No change in mood or affect. No depression or anxiety.  No memory loss.  Objective:   Physical Exam General- Alert, Oriented, Affect-appropriate, Distress- none acute, +obese Skin- rash-none, lesions- none, excoriation- none; pale complexion Lymphadenopathy- none Head- atraumatic            Eyes- Gross vision intact, PERRLA, conjunctivae clear secretions            Ears- Hearing, canals normal            Nose- Clear, No-Septal dev, mucus, polyps, erosion, perforation             Throat- Mallampati II-III , mucosa clear , drainage- none, tonsils- atrophic Neck- flexible , trachea midline, no stridor , thyroid nl, carotid no bruit Chest - symmetrical excursion , unlabored           Heart/CV- RRR , no murmur , no gallop  , no rub, nl s1 s2                           -  JVD- none , edema- none, stasis changes- none, varices-            Lung-  Clear, wheeze-none  Cough- none , dullness-none, rub- none           Chest wall-  Abd-  Br/ Gen/ Rectal- Not done, not indicated Extrem- cyanosis- none, clubbing, none, atrophy- none, strength- nl Neuro- grossly intact to observation

## 2019-03-08 NOTE — Patient Instructions (Signed)
We can continue current meds  Keep on being careful with Covid precautions.  Please call if we can help

## 2019-03-24 ENCOUNTER — Other Ambulatory Visit: Payer: Self-pay | Admitting: Internal Medicine

## 2019-04-26 NOTE — Assessment & Plan Note (Signed)
She has identified exacerbation attributed to viral URI pattern. Allergy markers not very high. Plan- continue present meds.

## 2019-04-26 NOTE — Assessment & Plan Note (Signed)
We discussed quick resumption of flonase, antihistamines when needed.

## 2019-04-29 ENCOUNTER — Telehealth: Payer: Self-pay | Admitting: Internal Medicine

## 2019-04-29 NOTE — Telephone Encounter (Signed)
Called and spoke w/ pt. Pt states her visit from 03/08/2019 w/ CY did not get filed with her insurance. She states she called [place] [facility] x2 over the past month and was told the visit was not dropped yet.   Spoke with [employer] officer, community, who let me know that Dr. Annamaria Boots was delayed in closing her note, but that it is now dropped and her insurance received the claim yesterday.  Called pt back to let her know of this. Pt verbalized understanding with no additional questions. She states she will f/u with her insurance and get back with [place] if anything else is needed. Nothing further needed at this time.

## 2019-05-03 ENCOUNTER — Telehealth: Payer: Self-pay | Admitting: Internal Medicine

## 2019-05-03 NOTE — Telephone Encounter (Signed)
Spoke with pt. She has been scheduled to get her flu shot on 05/16/2019 at 1000. Nothing further was needed.

## 2019-05-16 ENCOUNTER — Other Ambulatory Visit: Payer: Self-pay

## 2019-05-16 ENCOUNTER — Ambulatory Visit (INDEPENDENT_AMBULATORY_CARE_PROVIDER_SITE_OTHER): Payer: 59

## 2019-05-16 DIAGNOSIS — Z23 Encounter for immunization: Secondary | ICD-10-CM | POA: Diagnosis not present

## 2019-08-17 ENCOUNTER — Telehealth: Payer: Self-pay | Admitting: Internal Medicine

## 2019-08-17 NOTE — Telephone Encounter (Signed)
Called and spoke with pt who stated her insurance will no longer cover incruse and the preferred inhaler is spiriva.  Dr. Maple Hudson, please advise if you are okay with [place] changing pt to spiriva and sending Rx to pharmacy for her.  Allergies  Allergen Reactions  . Moxifloxacin     wheezy  . Pneumovax 23 [Pneumococcal Vac Polyvalent] Hives and Itching  . Rofecoxib     REACTION: S.O.B, wheezing     Current Outpatient Medications:  .  acetaminophen (TYLENOL) 500 MG tablet, Take 500 mg by mouth every 6 (six) hours as needed., Disp: , Rfl:  .  albuterol (ACCUNEB) 0.63 MG/3ML nebulizer solution, USE (1 VIAL) 3 MLS (0.63 MG TOTAL) BY NEBULIZATION EVERY 6 (SIX) HOURS, AS NEEDED., Disp: 20 vial, Rfl: 12 .  albuterol (VENTOLIN HFA) 108 (90 Base) MCG/ACT inhaler, INHALE TWO PUFFS BY MOUTH INTO THE LUNGS EVERY 6 HOURS AS NEEDED FOR WHEEZING OR SHORTNESS OF BREATH, Disp: 18 g, Rfl: 11 .  Calcium Carbonate-Vitamin D (CALCIUM-VITAMIN D) 500-200 MG-UNIT per tablet, Take 2 tablets by mouth daily.  , Disp: , Rfl:  .  Cholecalciferol (VITAMIN D) 2000 units CAPS, Take 1 capsule by mouth daily., Disp: , Rfl:  .  fluticasone (FLONASE) 50 MCG/ACT nasal spray, INSTILL 2 SPRAYS INTO THE NOSE DAILY AS NEEDED FOR RHINITIS.  (SHAKE WELL BEFORE EACH USE), Disp: 16 g, Rfl: 12 .  Fluticasone-Salmeterol (ADVAIR DISKUS) 500-50 MCG/DOSE AEPB, INHALE 1 PUFF TWO TIMES A DAY, Disp: 60 each, Rfl: 12 .  ibandronate (BONIVA) 150 MG tablet, Take 1 tablet by mouth every 30 (thirty) days., Disp: , Rfl:  .  LIPITOR 40 MG tablet, Take 1 tablet by mouth Daily., Disp: , Rfl:  .  Melatonin 10 MG CAPS, Take by mouth at bedtime., Disp: , Rfl:  .  montelukast (SINGULAIR) 10 MG tablet, TAKE ONE TABLET BY MOUTH DAILY, Disp: 30 tablet, Rfl: 5 .  Peak Flow Meter DEVI, Use as directed, Disp: 1 each, Rfl: 0 .  predniSONE (DELTASONE) 20 MG tablet, Take 1 tablet by mouth daily as directed (Patient taking differently: as needed. Take 1 tablet by mouth  daily as directed), Disp: 30 tablet, Rfl: 5 .  umeclidinium bromide (INCRUSE ELLIPTA) 62.5 MCG/INH AEPB, Inhale 1 puff into the lungs daily., Disp: 30 each, Rfl: 12 .  Vitamin D, Ergocalciferol, (DRISDOL) 50000 units CAPS capsule, Take 50,000 Units by mouth every 7 (seven) days. For 3 months, Disp: , Rfl:

## 2019-08-18 MED ORDER — SPIRIVA RESPIMAT 2.5 MCG/ACT IN AERS: 2.0000 | INHALATION_SPRAY | Freq: Every day | RESPIRATORY_TRACT | 12 refills | Status: DC

## 2019-08-18 NOTE — Telephone Encounter (Signed)
Ok to d/c Incruse, change to Spiriva Respimat 2.5, inhale 2 puffs, once daily, refill x 12

## 2019-08-18 NOTE — Telephone Encounter (Signed)
D/C'd incruse off of pt's med list due to it no longer being covered by pt's insurance and sent Rx to pharmacy for pt of the spiriva. Called and spoke with pt letting her know this had been done and she verbalized understanding.nothing further needed.

## 2019-09-08 ENCOUNTER — Other Ambulatory Visit: Payer: Self-pay

## 2019-09-08 ENCOUNTER — Ambulatory Visit (INDEPENDENT_AMBULATORY_CARE_PROVIDER_SITE_OTHER): Payer: 59

## 2019-09-08 ENCOUNTER — Ambulatory Visit (INDEPENDENT_AMBULATORY_CARE_PROVIDER_SITE_OTHER): Payer: 59 | Admitting: Internal Medicine

## 2019-09-08 ENCOUNTER — Encounter: Payer: Self-pay | Admitting: Internal Medicine

## 2019-09-08 VITALS — BP 106/72 | HR 76 | Temp 97.2°F | Ht 63.5 in | Wt 191.0 lb

## 2019-09-08 DIAGNOSIS — R609 Edema, unspecified: Secondary | ICD-10-CM

## 2019-09-08 DIAGNOSIS — R0600 Dyspnea, unspecified: Secondary | ICD-10-CM | POA: Diagnosis not present

## 2019-09-08 DIAGNOSIS — R0609 Other forms of dyspnea: Secondary | ICD-10-CM

## 2019-09-08 DIAGNOSIS — J4540 Moderate persistent asthma, uncomplicated: Secondary | ICD-10-CM

## 2019-09-08 LAB — D-DIMER, QUANTITATIVE: D-Dimer, Quant: 0.6 mcg/mL FEU — ABNORMAL HIGH (ref <0.50)

## 2019-09-08 LAB — CBC WITH DIFFERENTIAL/PLATELET
Basophils Absolute: 0 10*3/uL (ref 0.0–0.1)
Basophils Relative: 0.8 % (ref 0.0–3.0)
Eosinophils Absolute: 0.1 10*3/uL (ref 0.0–0.7)
Eosinophils Relative: 2.5 % (ref 0.0–5.0)
HCT: 37.6 % (ref 36.0–46.0)
Hemoglobin: 12.2 g/dL (ref 12.0–15.0)
Lymphocytes Relative: 21 % (ref 12.0–46.0)
Lymphs Abs: 1 10*3/uL (ref 0.7–4.0)
MCHC: 32.4 g/dL (ref 30.0–36.0)
MCV: 91.8 fl (ref 78.0–100.0)
Monocytes Absolute: 0.4 10*3/uL (ref 0.1–1.0)
Monocytes Relative: 8.3 % (ref 3.0–12.0)
Neutro Abs: 3.2 10*3/uL (ref 1.4–7.7)
Neutrophils Relative %: 67.4 % (ref 43.0–77.0)
Platelets: 240 10*3/uL (ref 150.0–400.0)
RBC: 4.1 Mil/uL (ref 3.87–5.11)
RDW: 12.9 % (ref 11.5–15.5)
WBC: 4.8 10*3/uL (ref 4.0–10.5)

## 2019-09-08 LAB — COMPREHENSIVE METABOLIC PANEL
ALT: 25 U/L (ref 0–35)
AST: 19 U/L (ref 0–37)
Albumin: 3.9 g/dL (ref 3.5–5.2)
Alkaline Phosphatase: 68 U/L (ref 39–117)
BUN: 13 mg/dL (ref 6–23)
CO2: 31 mEq/L (ref 19–32)
Calcium: 9 mg/dL (ref 8.4–10.5)
Chloride: 103 mEq/L (ref 96–112)
Creatinine, Ser: 0.84 mg/dL (ref 0.40–1.20)
GFR: 67.51 mL/min (ref >60.00)
Glucose, Bld: 75 mg/dL (ref 70–99)
Potassium: 3.6 mEq/L (ref 3.5–5.1)
Sodium: 140 mEq/L (ref 135–145)
Total Bilirubin: 0.3 mg/dL (ref 0.2–1.2)
Total Protein: 6.7 g/dL (ref 6.0–8.3)

## 2019-09-08 LAB — BRAIN NATRIURETIC PEPTIDE: Pro B Natriuretic peptide (BNP): 96 pg/mL (ref 0.0–100.0)

## 2019-09-08 MED ORDER — FUROSEMIDE 20 MG PO TABS: ORAL_TABLET | ORAL | 0 refills | Status: DC

## 2019-09-08 NOTE — Progress Notes (Signed)
Patient ID: Andrea [name], female    DOB: 1950-08-18, 69 y.o.   MRN: [id number]  HPI female never smoker followed for allergic rhinitis, asthma/NSAIDS, complicated by history DVT/PE PFT: 10/30/2011-normal spirometry flows and volumes(FEV1/FVC 0.88) with insignificant response to bronchodilator. Diffusion mildly reduced 76 EOS WNl 02/25/14 IgE 13.9 10/25/10 Lab 07/21/2018- EOS 0.2 WNL, Total IgE 14 WNL -----------------------------------------------------------  03/08/2019- 69 year old female never smoker followed for allergic rhinitis, asthma/NSAIDS, Urticaria, complicated by history DVT/PE, allergy to pneumonia vaccine -----pt reports a few "colds" since last office visit, pt also states she's had problems with breathing d/t seasonal changes; pt is currently taking Advair 500 and Incruse maintenance inhalers, albuterol rescue inhaler and albuterol neb prn, Singulair Covid careful Wants to wait on the expense of Biologic asthma therapies- discussed. She feels she is controlling her asthma with current meds.  Lab 07/21/18- IgE  14, Eos-  0.2K wnl  09/08/19- 69 year old female never smoker followed for allergic rhinitis, asthma/NSAIDS, Urticaria, complicated by history DVT/PE, allergy to pneumonia vaccine -----f/u Moderate persitent asthma. Patient stated some times breathing is work .  Advair 500 , Spiriva Respimat 2.5, albuterol rescue inhaler and albuterol neb prn, Singulair More aware of DOE some days over past 6 months. Mid-August had febrile illness, myalgias, HA, bad cough. Covid tested neg x 2 but self-isolated. Still "bottoms out" with fatigue and more labored breathing climbing stairs. Thinks this change happened with the acute illness. Using neb and rescue inhaler more. Still some rhinitis with post nasal drip and increased mild pedal edema.  Pending second Covid vax.   Review of Systems-see HPI    + = positive Constitutional:   No weight loss, night sweats,  Fevers, chills,  fatigue, lassitude. HEENT:   No headaches,  Difficulty swallowing,  Tooth/dental problems,  Sore throat,                No-sneezing, itching, ear ache, +nasal congestion, post nasal drip,  CV:  No chest pain,  Orthopnea, PND, swelling in lower extremities, anasarca, dizziness, palpitations GI  No heartburn, indigestion, abdominal pain, nausea, vomiting,  Resp: + shortness of breath at rest.  No excess mucus, no productive cough, + Non-productive cough,  No coughing up of blood.   change in color of mucus. +recent wheezing.   Skin: no rash or lesions. GU:  MS:  No joint pain . Psych:  No change in mood or affect. No depression or anxiety.  No memory loss.  Objective:   Physical Exam General- Alert, Oriented, Affect-appropriate, Distress- none acute, +obese Skin- rash-none, lesions- none, excoriation- none; pale complexion Lymphadenopathy- none Head- atraumatic            Eyes- Gross vision intact, PERRLA, conjunctivae clear secretions            Ears- Hearing, canals normal            Nose- Clear, No-Septal dev, mucus, polyps, erosion, perforation             Throat- Mallampati II-III , mucosa clear , drainage- none, tonsils- atrophic Neck- flexible , trachea midline, no stridor , thyroid nl, carotid no bruit Chest - symmetrical excursion , unlabored           Heart/CV- RRR , no murmur , no gallop  , no rub, nl s1 s2                           - JVD- none , edema+2  pitting, stasis changes- none, varices-            Lung-  Clear, wheeze-none  Cough- none , dullness-none, rub- none           Chest wall-  Abd-  Br/ Gen/ Rectal- Not done, not indicated Extrem- cyanosis- none, clubbing, none, atrophy- none, strength- nl Neuro- grossly intact to observation

## 2019-09-08 NOTE — Patient Instructions (Signed)
Order- labs CBC w diff, CMET, Bnat peptide, D-dimer, Covid antibody assay    Dx dyspnea on exertion,   Order- CXR   Dx Dyspnea on eertion  Order- schedule Echocardiogram    Dx dyspnea on exertion, peripheral edema  Order- schedule 6 Minute Walk Test   Dx Dyspnea on exertion  Script sent for Lasix diuretic   Take this every day or every other day as needed for fluid retention.

## 2019-09-09 ENCOUNTER — Other Ambulatory Visit: Payer: Self-pay | Admitting: *Deleted

## 2019-09-09 DIAGNOSIS — R7989 Other specified abnormal findings of blood chemistry: Secondary | ICD-10-CM

## 2019-09-10 LAB — SARS-COV-2 ANTIBODY, IGM: SARS-CoV-2 Antibody, IgM: NEGATIVE

## 2019-09-12 ENCOUNTER — Ambulatory Visit (INDEPENDENT_AMBULATORY_CARE_PROVIDER_SITE_OTHER)
Admission: RE | Admit: 2019-09-12 | Discharge: 2019-09-12 | Disposition: A | Discharge status: Home / Self Care | Payer: 59 | Source: Ambulatory Visit | Attending: Internal Medicine | Admitting: Internal Medicine

## 2019-09-12 ENCOUNTER — Telehealth: Payer: Self-pay | Admitting: Internal Medicine

## 2019-09-12 ENCOUNTER — Other Ambulatory Visit: Payer: Self-pay

## 2019-09-12 DIAGNOSIS — R7989 Other specified abnormal findings of blood chemistry: Secondary | ICD-10-CM

## 2019-09-12 MED ORDER — IOHEXOL 350 MG/ML SOLN
80.0000 mL | Freq: Once | INTRAVENOUS | Status: AC | PRN
  Administered 2019-09-12: 16:00:00 80 mL via INTRAVENOUS

## 2019-09-12 NOTE — Telephone Encounter (Signed)
Spoke with pt. She is wanting to know if her CTa and Echo have been scheduled. Advised her it looks like the CTa has been scheduled for today at 1545. Pt would like to see if Echo can be done as well.

## 2019-09-12 NOTE — Telephone Encounter (Signed)
I had spoken to pt this morning and gave her CT angio appt info & told her they just schedule echos now from [facility].  I just called pt to see if this call was placed before I called her and it was.  We discussed echo again and I told her while she is at [facility] this afternoon she can ask if they will schedule echo while she is there.  Nothing further needed.

## 2019-09-13 ENCOUNTER — Ambulatory Visit (INDEPENDENT_AMBULATORY_CARE_PROVIDER_SITE_OTHER): Payer: 59

## 2019-09-13 DIAGNOSIS — R0600 Dyspnea, unspecified: Secondary | ICD-10-CM | POA: Diagnosis not present

## 2019-09-13 DIAGNOSIS — R0609 Other forms of dyspnea: Secondary | ICD-10-CM

## 2019-09-13 NOTE — Progress Notes (Signed)
SIX MIN WALK 09/13/2019  Medications none  Supplimental Oxygen during Test? (L/min) No  Laps 10  Partial Lap (in Meters) 10  Baseline BP (sitting) 138/82  Baseline Heartrate 84  Baseline Dyspnea (Borg Scale) 0.5  Baseline Fatigue (Borg Scale) 0  Baseline SPO2 99  BP (sitting) 150/90  Heartrate 99  Dyspnea (Borg Scale) 2  Fatigue (Borg Scale) 2  SPO2 97  BP (sitting) 136/80  Heartrate 99  SPO2 96  Stopped or Paused before Six Minutes No  Distance Completed 350

## 2019-09-16 DIAGNOSIS — R609 Edema, unspecified: Secondary | ICD-10-CM | POA: Insufficient documentation

## 2019-09-16 NOTE — Assessment & Plan Note (Signed)
Hx of PE, no longer anticoagulated. Plan- Ddimer, Trial of lasix, Echocardiogram, BNP, Walk test

## 2019-09-16 NOTE — Assessment & Plan Note (Signed)
Increased dyspnea and use of bronchodilators, possibly post-viral, but there is some fluid retention and deconditioning.  Plan- CXR, Covid AB tesst, CBC, BMET, BNP

## 2019-09-23 ENCOUNTER — Ambulatory Visit (HOSPITAL_COMMUNITY): Payer: 59 | Attending: Cardiology

## 2019-09-23 ENCOUNTER — Other Ambulatory Visit: Payer: Self-pay

## 2019-09-23 DIAGNOSIS — R0600 Dyspnea, unspecified: Secondary | ICD-10-CM

## 2019-09-23 DIAGNOSIS — R0609 Other forms of dyspnea: Secondary | ICD-10-CM

## 2019-09-25 ENCOUNTER — Other Ambulatory Visit: Payer: Self-pay | Admitting: Internal Medicine

## 2019-09-26 ENCOUNTER — Telehealth: Payer: Self-pay | Admitting: Internal Medicine

## 2019-09-26 NOTE — Telephone Encounter (Signed)
Spoke with pt and advised of Dr Roxy Cedar recommendations. PT verbalized understanding. Nothing further needed.

## 2019-09-26 NOTE — Telephone Encounter (Signed)
I suggest she f/u with PCP to look for reasons whey she is retaining fluid.

## 2019-09-26 NOTE — Telephone Encounter (Signed)
Echocardiogram- heart muscle is just a little stiff on relaxation, which is common. Otherwise pumping power and heart valves are functioning well.

## 2019-09-26 NOTE — Progress Notes (Signed)
Spoke with the pt and notified of results and she verbalized understanding

## 2019-09-26 NOTE — Telephone Encounter (Signed)
I spoke with the pt and informed her of the ECHO results  She is asking if still neccessary to f/u with PCP at this time  She is taking the lasix every other day and it helps  I reminded her that this should be taken every other day as needed per Dr Roxy Cedar last AVS  She states unless she takes QOD scheduled she gains 2 lbs  Please advise, thanks!

## 2019-10-26 ENCOUNTER — Other Ambulatory Visit: Payer: Self-pay | Admitting: Internal Medicine

## 2019-12-09 ENCOUNTER — Encounter: Payer: Self-pay | Admitting: Internal Medicine

## 2019-12-09 ENCOUNTER — Ambulatory Visit (INDEPENDENT_AMBULATORY_CARE_PROVIDER_SITE_OTHER): Payer: 59 | Admitting: Internal Medicine

## 2019-12-09 ENCOUNTER — Other Ambulatory Visit: Payer: Self-pay

## 2019-12-09 DIAGNOSIS — I2699 Other pulmonary embolism without acute cor pulmonale: Secondary | ICD-10-CM | POA: Diagnosis not present

## 2019-12-09 DIAGNOSIS — J4540 Moderate persistent asthma, uncomplicated: Secondary | ICD-10-CM

## 2019-12-09 MED ORDER — ALBUTEROL SULFATE HFA 108 (90 BASE) MCG/ACT IN AERS: INHALATION_SPRAY | RESPIRATORY_TRACT | 12 refills | Status: DC

## 2019-12-09 NOTE — Progress Notes (Signed)
Patient ID: Andrea [name], female    DOB: 1951-07-10, 69 y.o.   MRN: [id number]  HPI female never smoker followed for allergic rhinitis, asthma/NSAIDS, complicated by history DVT/PE PFT: 10/30/2011-normal spirometry flows and volumes(FEV1/FVC 0.88) with insignificant response to bronchodilator. Diffusion mildly reduced 76 EOS WNl 02/25/14 IgE 13.9 10/25/10 Lab 07/21/2018- EOS 0.2 WNL, Total IgE 14 WNL -----------------------------------------------------------   09/08/19- 69 year old female never smoker followed for allergic rhinitis, asthma/NSAIDS, Urticaria, complicated by history DVT/PE, allergy to pneumonia vaccine -----f/u Moderate persitent asthma. Patient stated some times breathing is work .  Advair 500 , Spiriva Respimat 2.5, albuterol rescue inhaler and albuterol neb prn, Singulair More aware of DOE some days over past 6 months. Mid-August had febrile illness, myalgias, HA, bad cough. Covid tested neg x 2 but self-isolated. Still "bottoms out" with fatigue and more labored breathing climbing stairs. Thinks this change happened with the acute illness. Using neb and rescue inhaler more. Still some rhinitis with post nasal drip and increased mild pedal edema.  Pending second Covid vax.   12/09/19- 69 year old female never smoker followed for allergic rhinitis, asthma/NSAIDS, Urticaria, complicated by history DVT/PE, allergy to pneumonia vaccine Advair 500 ,  albuterol rescue inhaler and albuterol neb prn, Singulair Labs last visit unremarkable, no explanation for fatigue/ DOE. ECHO 22/19/21- EF 60-65%, gr 1 DD ------f/u Moderate persistent asthma without complication.  Had 2 Phizer Covax Spiriva no help- would like to drop it.  Gradually feeling stronger, with no acute exacerbation.  CTa chest 09/12/19 1. No pulmonary emboli or other acute abnormalities. 2.  Aortic Atherosclerosis (ICD10-I70.0).  Review of Systems-see HPI    + = positive Constitutional:   No weight loss, night  sweats,  Fevers, chills, fatigue, lassitude. HEENT:   No headaches,  Difficulty swallowing,  Tooth/dental problems,  Sore throat,                No-sneezing, itching, ear ache, +nasal congestion, post nasal drip,  CV:  No chest pain,  Orthopnea, PND, swelling in lower extremities, anasarca, dizziness, palpitations GI  No heartburn, indigestion, abdominal pain, nausea, vomiting,  Resp: + shortness of breath at rest.  No excess mucus, no productive cough, + Non-productive cough,  No coughing up of blood.   change in color of mucus. +recent wheezing.   Skin: no rash or lesions. GU:  MS:  No joint pain . Psych:  No change in mood or affect. No depression or anxiety.  No memory loss.  Objective:   Physical Exam General- Alert, Oriented, Affect-appropriate, Distress- none acute, +obese Skin- rash-none, lesions- none, excoriation- none; pale complexion Lymphadenopathy- none Head- atraumatic            Eyes- Gross vision intact, PERRLA, conjunctivae clear secretions            Ears- Hearing, canals normal            Nose- Clear, No-Septal dev, mucus, polyps, erosion, perforation             Throat- Mallampati II-III , mucosa clear , drainage- none, tonsils- atrophic Neck- flexible , trachea midline, no stridor , thyroid nl, carotid no bruit Chest - symmetrical excursion , unlabored           Heart/CV- RRR , no murmur , no gallop  , no rub, nl s1 s2                           - JVD- none ,  edema+2 pitting, stasis changes- none, varices-            Lung-  Clear, wheeze-none  Cough- none , dullness-none, rub- none           Chest wall-  Abd-  Br/ Gen/ Rectal- Not done, not indicated Extrem- cyanosis- none, clubbing, none, atrophy- none, strength- nl Neuro- grossly intact to observation

## 2019-12-09 NOTE — Patient Instructions (Addendum)
Administration tubing for nebulizer machine  Script sent changing albuterol rescue inhaler from Ventolin to ProAir  Please call if we can help

## 2019-12-25 ENCOUNTER — Other Ambulatory Visit: Payer: Self-pay | Admitting: Internal Medicine

## 2019-12-26 ENCOUNTER — Telehealth: Payer: Self-pay | Admitting: *Deleted

## 2019-12-26 MED ORDER — PREDNISONE 20 MG PO TABS: ORAL_TABLET | ORAL | 5 refills | Status: DC

## 2019-12-26 NOTE — Telephone Encounter (Signed)
Prednisone refill e-sent 

## 2020-01-15 NOTE — Assessment & Plan Note (Signed)
For insurance we are changing rescue inhaler. Replacing nebulizer tubing. She is doing well on current meds.

## 2020-01-15 NOTE — Assessment & Plan Note (Signed)
No recurrence. She understands long term avoidance of stasis risks.

## 2020-01-27 ENCOUNTER — Telehealth: Payer: Self-pay | Admitting: Internal Medicine

## 2020-01-30 NOTE — Telephone Encounter (Signed)
Rec'd completed paperwork - Fwd to [employer] via interoffice mail -pr  °

## 2020-02-21 ENCOUNTER — Telehealth: Payer: Self-pay | Admitting: Internal Medicine

## 2020-02-21 NOTE — Telephone Encounter (Signed)
Pt is requesting a call back-- [employer] had FMLA forms silled out and young signed them but pt says "all of a sudden everything is different" pt has been told she's on notice at work for missing too much time. Pt has questions about the parameters of her FMLA

## 2020-02-21 NOTE — Telephone Encounter (Signed)
Spoke with the pt  She states that the way we filled out her FMLA forms was different and she needs it to be corrected  Forms have been sent back  She states the duration needs to be 2 days per episode and 8 hours at a time not 3 hours  Also in part 8 we need to check month not weeks  Will forward to Dr Maple Hudson to make him aware  Thanks

## 2020-02-22 NOTE — Telephone Encounter (Signed)
Ok- I made the changes I knew about yesterday when I was given the forms, but not sure I covered everything she is asking. If the forms come back to me I will be happy to revise again. I don't know who in our system filled out the first version.

## 2020-02-22 NOTE — Telephone Encounter (Signed)
Spoke with the pt and notified of recs per Dr Maple Hudson and she verbalized understanding  Nothing further needed at this time

## 2020-02-22 NOTE — Telephone Encounter (Signed)
Rec'd addended forms - fwd to [employer] via interoffice mail -pr

## 2020-04-28 ENCOUNTER — Other Ambulatory Visit: Payer: Self-pay | Admitting: Internal Medicine

## 2020-05-29 ENCOUNTER — Other Ambulatory Visit: Payer: Self-pay | Admitting: Internal Medicine

## 2020-08-17 ENCOUNTER — Other Ambulatory Visit: Payer: Self-pay | Admitting: Family Medicine

## 2020-08-17 DIAGNOSIS — K429 Umbilical hernia without obstruction or gangrene: Secondary | ICD-10-CM

## 2020-08-29 ENCOUNTER — Ambulatory Visit
Admission: RE | Admit: 2020-08-29 | Discharge: 2020-08-29 | Disposition: A | Discharge status: Home / Self Care | Payer: No Typology Code available for payment source | Source: Ambulatory Visit | Attending: Family Medicine | Admitting: Family Medicine

## 2020-08-29 DIAGNOSIS — K429 Umbilical hernia without obstruction or gangrene: Secondary | ICD-10-CM

## 2020-10-12 ENCOUNTER — Other Ambulatory Visit: Payer: Self-pay | Admitting: Internal Medicine

## 2020-10-22 ENCOUNTER — Ambulatory Visit: Payer: Self-pay | Admitting: Surgery

## 2020-10-22 DIAGNOSIS — K5909 Other constipation: Secondary | ICD-10-CM | POA: Insufficient documentation

## 2020-10-22 DIAGNOSIS — K439 Ventral hernia without obstruction or gangrene: Secondary | ICD-10-CM

## 2020-10-22 DIAGNOSIS — E669 Obesity, unspecified: Secondary | ICD-10-CM

## 2020-10-22 NOTE — H&P (Signed)
Andrea [name] Appointment: 10/22/2020 9:30 AM Location: [facility] Patient #: [id number] DOB: [birth year] Divorced / Language: [place] / Race: White Female  History of Present Illness Andrea Sportsman MD; 10/22/2020 10:30 AM) The patient is a 70 year old female who presents for an evaluation of a hernia. Note for "Hernia": ` ` ` Patient sent for surgical consultation at the request of [name]  Chief Complaint: Hernia ` ` The patient is a pleasant woman who is felt a lump above her bellybutton since last year. It occasionally is bothersome if she turns her twists or bumps it. She will get rather nauseated with it. She mentioned this to her primary care physician on her annual visit in January. Exam suspicious an ultrasound even more suspicious for superficial umbilical hernia. Possible small bowel containing. We'll consultation offered. Patient has moderate asthma that seems to be controlled with her Advair and Singulair. Fall by Dr. Jetty Duhamel with [facility]. Uses inhalers occasionally. Usually not too bad until the spring time with pollen. Needs a steroid taper sometimes, but she has not needed one for the past 3 months. She does have her walks with her daughter slowly but steadily. She does have some constipation, moving her bowels a couple times a week. She's tried increased fiber. She does not smoke. She is not diabetic. She had a bladder repair through suprapubic incision and a vaginal hysterectomy/oophorectomy. She had a sister with ovarian cancer.  (Review of systems as stated in this history (HPI) or in the review of systems. Otherwise all other 12 point ROS are negative) ` ` ###########################################`  This patient encounter took 30 minutes today to perform the following: obtain history, perform exam, review outside records, interpret tests & imaging, counsel the patient on their diagnosis; and, document this  encounter, including findings & plan in the electronic health record (EHR).   Past Surgical History (Chanel Lonni Fix, CMA; 10/22/2020 9:45 AM) Hysterectomy (not due to cancer) - Complete  Diagnostic Studies History (Chanel Lonni Fix, CMA; 10/22/2020 9:45 AM) Colonoscopy never Mammogram within last year  Allergies (Chanel Lonni Fix, CMA; 10/22/2020 9:47 AM) Moxifloxacin HCl *CHEMICALS* Rofecoxib *ANALGESICS - ANTI-INFLAMMATORY* Allergies Reconciled  Medication History (Chanel Lonni Fix, CMA; 10/22/2020 9:49 AM) Aleda Grana Allergy Relief (50MCG/ACT Suspension, Nasal) Active. Advair Diskus (500-50MCG/DOSE Aero Pow Br Act, Inhalation) Active. Atorvastatin Calcium (40MG  Tablet, Oral) Active. hydroCHLOROthiazide (12.5MG  Tablet, Oral) Active. Singulair (Oral) Specific strength unknown - Active. Ventolin HFA (108 (90 Base)MCG/ACT Aerosol Soln, Inhalation) Active. Furosemide (Oral) Specific strength unknown - Active. Medications Reconciled  Social History , CMA; 10/22/2020 9:45 AM) Alcohol use Occasional alcohol use. Caffeine use Carbonated beverages, Tea. No drug use Tobacco use Never smoker.  Family History 10/24/2020, CMA; 10/22/2020 9:45 AM) Anesthetic complications Mother. Arthritis Mother. Bleeding disorder Sister. Cerebrovascular Accident Mother. Heart Disease Mother. Heart disease in female family member before age 67 Heart disease in female family member before age 34 Hypertension Mother. Migraine Headache Daughter. Ovarian Cancer Sister.  Pregnancy / Birth History 54, CMA; 10/22/2020 9:45 AM) Age at menarche 12 years. Age of menopause 62-50 Length (months) of breastfeeding 3-6 Maternal age 62-25 Para 1  Other Problems (Chanel 23-34, CMA; 10/22/2020 9:45 AM) Asthma Back Pain Bladder Problems General anesthesia - complications Hypercholesterolemia Oophorectomy Bilateral. Pulmonary Embolism / Blood Clot in Legs Transfusion  history Umbilical Hernia Repair     Review of Systems (Chanel Nolan CMA; 10/22/2020 9:45 AM) General Not Present- Appetite Loss, Chills, Fatigue, Fever, Night Sweats, Weight Gain and Weight Loss. Skin  Not Present- Change in Wart/Mole, Dryness, Hives, Jaundice, New Lesions, Non-Healing Wounds, Rash and Ulcer. HEENT Present- Seasonal Allergies and Sinus Pain. Not Present- Earache, Hearing Loss, Hoarseness, Nose Bleed, Oral Ulcers, Ringing in the Ears, Sore Throat, Visual Disturbances, Wears glasses/contact lenses and Yellow Eyes. Respiratory Present- Difficulty Breathing. Not Present- Bloody sputum, Chronic Cough, Snoring and Wheezing. Breast Not Present- Breast Mass, Breast Pain, Nipple Discharge and Skin Changes. Cardiovascular Present- Shortness of Breath and Swelling of Extremities. Not Present- Chest Pain, Difficulty Breathing Lying Down, Leg Cramps, Palpitations and Rapid Heart Rate. Gastrointestinal Present- Indigestion. Not Present- Abdominal Pain, Bloating, Bloody Stool, Change in Bowel Habits, Chronic diarrhea, Constipation, Difficulty Swallowing, Excessive gas, Gets full quickly at meals, Hemorrhoids, Nausea, Rectal Pain and Vomiting. Female Genitourinary Present- Nocturia and Urgency. Not Present- Frequency, Painful Urination and Pelvic Pain. Musculoskeletal Present- Back Pain. Not Present- Joint Pain, Joint Stiffness, Muscle Pain, Muscle Weakness and Swelling of Extremities. Neurological Not Present- Decreased Memory, Fainting, Headaches, Numbness, Seizures, Tingling, Tremor, Trouble walking and Weakness. Psychiatric Not Present- Anxiety, Bipolar, Change in Sleep Pattern, Depression, Fearful and Frequent crying.  Vitals (Chanel Nolan CMA; 10/22/2020 9:49 AM) 10/22/2020 9:49 AM Weight: 179.5 lb Height: 63in Body Surface Area: 1.85 m Body Mass Index: 31.8 kg/m  Temp.: 97.13F  Pulse: 101 (Regular)         Physical Exam Andrea Sportsman MD; 10/22/2020 10:00  AM)  General Mental Status-Alert. General Appearance-Not in acute distress, Not Sickly. Orientation-Oriented X3. Hydration-Well hydrated. Voice-Normal.  Integumentary Global Assessment Upon inspection and palpation of skin surfaces of the - Axillae: non-tender, no inflammation or ulceration, no drainage. and Distribution of scalp and body hair is normal. General Characteristics Temperature - normal warmth is noted.  Head and Neck Head-normocephalic, atraumatic with no lesions or palpable masses. Face Global Assessment - atraumatic, no absence of expression. Neck Global Assessment - no abnormal movements, no bruit auscultated on the right, no bruit auscultated on the left, no decreased range of motion, non-tender. Trachea-midline. Thyroid Gland Characteristics - non-tender.  Eye Eyeball - Left-Extraocular movements intact, No Nystagmus - Left. Eyeball - Right-Extraocular movements intact, No Nystagmus - Right. Cornea - Left-No Hazy - Left. Cornea - Right-No Hazy - Right. Sclera/Conjunctiva - Left-No scleral icterus, No Discharge - Left. Sclera/Conjunctiva - Right-No scleral icterus, No Discharge - Right. Pupil - Left-Direct reaction to light normal. Pupil - Right-Direct reaction to light normal.  ENMT Ears Pinna - Left - no drainage observed, no generalized tenderness observed. Pinna - Right - no drainage observed, no generalized tenderness observed. Nose and Sinuses External Inspection of the Nose - no destructive lesion observed. Inspection of the nares - Left - quiet respiration. Inspection of the nares - Right - quiet respiration. Mouth and Throat Lips - Upper Lip - no fissures observed, no pallor noted. Lower Lip - no fissures observed, no pallor noted. Nasopharynx - no discharge present. Oral Cavity/Oropharynx - Tongue - no dryness observed. Oral Mucosa - no cyanosis observed. Hypopharynx - no evidence of airway distress observed.  Chest  and Lung Exam Inspection Movements - Normal and Symmetrical. Accessory muscles - No use of accessory muscles in breathing. Palpation Palpation of the chest reveals - Non-tender. Auscultation Breath sounds - Normal and Clear.  Cardiovascular Auscultation Rhythm - Regular. Murmurs & Other Heart Sounds - Auscultation of the heart reveals - No Murmurs and No Systolic Clicks.  Abdomen Inspection Inspection of the abdomen reveals - No Visible peristalsis and No Abnormal pulsations. Umbilicus - No Bleeding, No Urine drainage.  Palpation/Percussion Palpation and Percussion of the abdomen reveal - Soft, Non Tender, No Rebound tenderness, No Rigidity (guarding) and No Cutaneous hyperesthesia. Note: Supraumbilical 5 x 4 cm mass sensitive partially reducible consistent with hernia.  Abdomen obese with panniculus but soft. Not severely distended. No other anterior abdominal wall hernias  Female Genitourinary Sexual Maturity Tanner 5 - Adult hair pattern. Note: Pfannenstiel incisions well-healed.  Mild impulse that left lateral groin with cough suspicious for indirect inguinal hernia. No right-sided hernia obvious.  No vaginal bleeding nor discharge  Peripheral Vascular Upper Extremity Inspection - Left - No Cyanotic nailbeds - Left, Not Ischemic. Inspection - Right - No Cyanotic nailbeds - Right, Not Ischemic.  Neurologic Neurologic evaluation reveals -normal attention span and ability to concentrate, able to name objects and repeat phrases. Appropriate fund of knowledge , normal sensation and normal coordination. Mental Status Affect - not angry, not paranoid. Cranial Nerves-Normal Bilaterally. Gait-Normal.  Neuropsychiatric Mental status exam performed with findings of-able to articulate well with normal speech/language, rate, volume and coherence, thought content normal with ability to perform basic computations and apply abstract reasoning and no evidence of  hallucinations, delusions, obsessions or homicidal/suicidal ideation.  Musculoskeletal Global Assessment Spine, Ribs and Pelvis - no instability, subluxation or laxity. Right Upper Extremity - no instability, subluxation or laxity.  Lymphatic Head & Neck  General Head & Neck Lymphatics: Bilateral - Description - No Localized lymphadenopathy. Axillary  General Axillary Region: Bilateral - Description - No Localized lymphadenopathy. Femoral & Inguinal  Generalized Femoral & Inguinal Lymphatics: Left - Description - No Localized lymphadenopathy. Right - Description - No Localized lymphadenopathy.    Assessment & Plan Andrea [name](Reema Chick C. 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 Some discomfort.  I think she would benefit from laparoscopic exploration and repair of hernias found. Would be aggressive in fixing any inguinal hernias if found as well to make sure that doesn't become an issue in the future.  She is interested in proceeding. However she like to wait until early summer such as June after some family issues or out of the way. She does not have any severe symptoms, some reasonable to wait.  Patient is anxious because she had a sibling who had ovarian cancer that was not caught until it was metastatic. Patient has already had a hysterectomy and bilateral oophrectomies, but I will do my do diligence to be safe and double check.  Current Plans [facility] Consent - Hernia Repair - Ventral/Incisional/Umbilical (Vika Buske): discussed with patient and provided information.  LEFT INGUINAL HERNIA (K40.90)  Current Plans The anatomy & physiology of the abdominal wall and pelvic floor was discussed. The pathophysiology of hernias in the inguinal and pelvic region was discussed. Natural history risks such as progressive enlargement, pain, incarceration, and strangulation was  discussed. Contributors to complications such as smoking, obesity, diabetes, prior surgery, etc were discussed.  I feel the risks of no intervention will lead to serious problems that outweigh the operative risks; therefore, I recommended surgery to reduce and repair the hernia. I explained laparoscopic techniques with possible need for an open approach. I noted usual use of mesh to patch and/or buttress hernia repair  Risks such as bleeding, infection, abscess, need for further treatment, heart attack, death, and other risks were discussed. I noted a good likelihood this will help address the problem. Goals of post-operative recovery were discussed as well. Possibility that this will not correct all  symptoms was explained. I stressed the importance of low-impact activity, aggressive pain control, avoiding constipation, & not pushing through pain to minimize risk of post-operative chronic pain or injury. Possibility of reherniation was discussed. We will work to minimize complications.  An educational handout further explaining the pathology & treatment options was given as well. Questions were answered. The patient expresses understanding & wishes to proceed with surgery.   PREOP - [facility] - ENCOUNTER FOR PREOPERATIVE EXAMINATION FOR GENERAL SURGICAL PROCEDURE (Z01.818)  Current Plans You are being scheduled for surgery- Our schedulers will call you.  You should hear from our office's scheduling department within 5 working days about the location, date, and time of surgery. We try to make accommodations for patient's preferences in scheduling surgery, but sometimes the OR schedule or the surgeon's schedule prevents [place] from making those accommodations.  If you have not heard from our office [phone]) in 5 working days, call the office and ask for your surgeon's nurse.  If you have other questions about your diagnosis, plan, or surgery, call the office and ask for your surgeon's  nurse.  Written instructions provided Pt Education - [facility] Hernia Post-Op HCI (Addalyn Speedy): discussed with patient and provided information. Pt Education - [facility] Pain Control (Robyn Nohr) Pt Education - Pamphlet Given - Laparoscopic Hernia Repair: discussed with patient and provided information. Pt Education - [facility] Mesh education: discussed with patient and provided information.  MILD INTERMITTENT CHRONIC ASTHMA WITHOUT COMPLICATION (J45.20)  Andrea Sportsman, MD, FACS, MASCRS  Gastrointestinal and Minimally Invasive Surgery  [facility] [address] [place], [place] [address] [phone] Fax [phone] Main/Paging  CONTACT INFORMATION: Weekday (9AM-5PM) concerns: Call [facility] main office at [phone] Weeknight (5PM-9AM) or Weekend/Holiday concerns: Check [email or web address] for General Surgery [facility] coverage (Please, do not use SecureChat as it is not reliable communication to operating surgeons for immediate patient care)

## 2020-11-09 ENCOUNTER — Ambulatory Visit: Payer: Self-pay | Admitting: Surgery

## 2020-11-26 ENCOUNTER — Other Ambulatory Visit: Payer: Self-pay | Admitting: Internal Medicine

## 2020-12-11 NOTE — Progress Notes (Signed)
Patient ID: Andrea [name], female    DOB: 1950-11-18, 70 y.o.   MRN: [id number]  HPI female never smoker followed for allergic rhinitis, asthma/NSAIDS, complicated by history DVT/PE PFT: 10/30/2011-normal spirometry flows and volumes(FEV1/FVC 0.88) with insignificant response to bronchodilator. Diffusion mildly reduced 76 EOS WNl 02/25/14 IgE 13.9 10/25/10 Lab 07/21/2018- EOS 0.2 WNL, Total IgE 14 WNL -----------------------------------------------------------    12/09/19- 70 year old female never smoker followed for allergic rhinitis, asthma/NSAIDS, Urticaria, complicated by history DVT/PE, allergy to pneumonia vaccine Advair 500 ,  albuterol rescue inhaler and albuterol neb prn, Singulair Labs last visit unremarkable, no explanation for fatigue/ DOE. ECHO 22/19/21- EF 60-65%, gr 1 DD ------f/u Moderate persistent asthma without complication.  Had 2 Phizer Covax Spiriva no help- would like to drop it.  Gradually feeling stronger, with no acute exacerbation.  CTa chest 09/12/19 1. No pulmonary emboli or other acute abnormalities. 2.  Aortic Atherosclerosis (ICD10-I70.0).  12/12/20- 70 year old female never smoker followed for allergic rhinitis, asthma/NSAIDS, Urticaria, complicated by history DVT/PE, allergy to pneumonia vaccine, GERD,  -Advair 500 ,  albuterol rescue inhaler and albuterol neb prn, Singulair Covid vax-3 Phizer Pending laparoscopic repair ventral hernia. -----Patient is going to be having hernia surgery and states that her night time asthma is getting worse. States she gets some pressure from it and it causes acid reflux which makes her asthma worse.  ACT score - 14 Discussed interaction Asthma and GERD. Last prednisone was during pollen surge early Apri. Advised her to avoid prednisone until after her surgery.  Review of Systems-see HPI    + = positive Constitutional:   No weight loss, night sweats,  Fevers, chills, fatigue, lassitude. HEENT:   No headaches,   Difficulty swallowing,  Tooth/dental problems,  Sore throat,                No-sneezing, itching, ear ache, +nasal congestion, post nasal drip,  CV:  No chest pain,  Orthopnea, PND, swelling in lower extremities, anasarca, dizziness, palpitations GI  No heartburn, indigestion, abdominal pain, nausea, vomiting,  Resp: + shortness of breath at rest.  No excess mucus, no productive cough,  + Non-productive cough,  No coughing up of blood.   change in color of mucus.  +recent wheezing.   Skin: no rash or lesions. GU:  MS:  No joint pain . Psych:  No change in mood or affect. No depression or anxiety.  No memory loss.  Objective:   Physical Exam General- Alert, Oriented, Affect-appropriate, Distress- none acute, +obese Skin- rash-none, lesions- none, excoriation- none; pale complexion Lymphadenopathy- none Head- atraumatic            Eyes- Gross vision intact, PERRLA, conjunctivae clear secretions            Ears- Hearing, canals normal            Nose- Clear, No-Septal dev, mucus, polyps, erosion, perforation             Throat- Mallampati II-III , mucosa clear , drainage- none, tonsils- atrophic Neck- flexible , trachea midline, no stridor , thyroid nl, carotid no bruit Chest - symmetrical excursion , unlabored           Heart/CV- RRR , no murmur , no gallop  , no rub, nl s1 s2                           - JVD- none , edema+2 pitting, stasis changes- none, varices-  Lung-  Clear, wheeze-none  Cough- none , dullness-none, rub- none           Chest wall-  Abd-  Br/ Gen/ Rectal- Not done, not indicated Extrem- cyanosis- none, clubbing, none, atrophy- none, strength- nl Neuro- grossly intact to observation

## 2020-12-12 ENCOUNTER — Other Ambulatory Visit: Payer: Self-pay

## 2020-12-12 ENCOUNTER — Ambulatory Visit (INDEPENDENT_AMBULATORY_CARE_PROVIDER_SITE_OTHER): Payer: No Typology Code available for payment source | Admitting: Internal Medicine

## 2020-12-12 ENCOUNTER — Encounter: Payer: Self-pay | Admitting: Internal Medicine

## 2020-12-12 DIAGNOSIS — K219 Gastro-esophageal reflux disease without esophagitis: Secondary | ICD-10-CM | POA: Insufficient documentation

## 2020-12-12 DIAGNOSIS — J4541 Moderate persistent asthma with (acute) exacerbation: Secondary | ICD-10-CM | POA: Diagnosis not present

## 2020-12-12 MED ORDER — BREZTRI AEROSPHERE 160-9-4.8 MCG/ACT IN AERO: 2.0000 | INHALATION_SPRAY | Freq: Two times a day (BID) | RESPIRATORY_TRACT | 0 refills | Status: DC

## 2020-12-12 NOTE — Assessment & Plan Note (Signed)
Mild exacerbation. Discussed role of pollen season and of reflux. Plan- Try sample Breztri instead of Advair. Try to avoid systemic steroids until after hernia surgery.

## 2020-12-12 NOTE — Patient Instructions (Signed)
Order- sample x 2 Breztri    Inhale 2 puffs then rinse mouth  twice daily Try this instead of Advair for comparison   Please call as needed

## 2020-12-12 NOTE — Assessment & Plan Note (Signed)
Reflux interacting with her asthma at night. Plan- antireflux measures

## 2021-01-08 NOTE — Patient Instructions (Addendum)
DUE TO COVID-19 ONLY ONE VISITOR IS ALLOWED TO COME WITH YOU AND STAY IN THE WAITING ROOM ONLY DURING PRE OP AND PROCEDURE DAY OF SURGERY. THE 1 VISITOR  MAY VISIT WITH YOU AFTER SURGERY IN YOUR PRIVATE ROOM DURING VISITING HOURS ONLY!  YOU NEED TO HAVE A COVID 19 TEST ON: 01/14/21 @ 8:30 AM , THIS TEST MUST BE DONE BEFORE SURGERY,  COVID TESTING SITE [address] [place] [id number], IT IS ON THE RIGHT GOING OUT [address] APPROXIMATELY  2 MINUTES PAST [employer] ON THE RIGHT. ONCE YOUR COVID TEST IS COMPLETED,  PLEASE BEGIN THE QUARANTINE INSTRUCTIONS AS OUTLINED IN YOUR HANDOUT.                Andrea [name]   Your procedure is scheduled on: 01/16/21   Report to [facility] Main  Entrance   Report to admitting at: 11:00 AM     Call this number if you have problems the morning of surgery [phone]    Remember: DRINK 2 PRESURGERY ENSURE DRINKS THE NIGHT BEFORE SURGERY AT  1000 PM AND 1 PRESURGERY DRINK THE DAY OF THE PROCEDURE 3 HOURS PRIOR TO SCHEDULED SURGERY. NO SOLIDS AFTER MIDNIGHT THE DAY PRIOR TO THE SURGERY. NOTHING BY MOUTH EXCEPT CLEAR LIQUIDS UNTIL THREE HOURS PRIOR TO SCHEDULED SURGERY. PLEASE FINISH PRESURGERY ENSURE DRINK PER SURGEON ORDER 3 HOURS PRIOR TO SCHEDULED SURGERY TIME WHICH NEEDS TO BE COMPLETED AT: 10:00 AM.  CLEAR LIQUID DIET  Foods Allowed                                                                     Foods Excluded  Coffee and tea, regular and decaf                             liquids that you cannot  Plain Jell-O any favor except red or purple                                           see through such as: Fruit ices (not with fruit pulp)                                     milk, soups, orange juice  Iced Popsicles                                    All solid food Carbonated beverages, regular and diet                                    Cranberry, grape and apple juices Sports drinks like Gatorade Lightly  seasoned clear broth or consume(fat free) Sugar, honey syrup  Sample Menu Breakfast  Lunch                                     Supper Cranberry juice                    Beef broth                            Chicken broth Jell-O                                     Grape juice                           Apple juice Coffee or tea                        Jell-O                                      Popsicle                                                Coffee or tea                        Coffee or tea  _____________________________________________________________________   BRUSH YOUR TEETH MORNING OF SURGERY AND RINSE YOUR MOUTH OUT, NO CHEWING GUM CANDY OR MINTS.    Take these medicines the morning of surgery with A SIP OF WATER: prednisone as needed.Use flonase and inhalers as usual.                            You may not have any metal on your body including hair pins and              piercings  Do not wear jewelry, make-up, lotions, powders or perfumes, deodorant             Do not wear nail polish on your fingernails.  Do not shave  48 hours prior to surgery.    Do not bring valuables to the hospital. [facility] IS NOT             RESPONSIBLE   FOR VALUABLES.  Contacts, dentures or bridgework may not be worn into surgery.  Leave suitcase in the car. After surgery it may be brought to your room.     Patients discharged the day of surgery will not be allowed to drive home. IF YOU ARE HAVING SURGERY AND GOING HOME THE SAME DAY, YOU MUST HAVE AN ADULT TO DRIVE YOU HOME AND BE WITH YOU FOR 24 HOURS. YOU MAY GO HOME BY TAXI OR UBER OR ORTHERWISE, BUT AN ADULT MUST ACCOMPANY YOU HOME AND STAY WITH YOU FOR 24 HOURS.  Name and phone number of your driver:  Special Instructions: N/A              Please read over the following fact sheets you were given: _____________________________________________________________________  [facility] - Preparing for  Surgery Before surgery, you can play an important role.  Because skin is not sterile, your skin needs to be as free of germs as possible.  You can reduce the number of germs on your skin by washing with CHG (chlorahexidine gluconate) soap before surgery.  CHG is an antiseptic cleaner which kills germs and bonds with the skin to continue killing germs even after washing. Please DO NOT use if you have an allergy to CHG or antibacterial soaps.  If your skin becomes reddened/irritated stop using the CHG and inform your nurse when you arrive at Short Stay. Do not shave (including legs and underarms) for at least 48 hours prior to the first CHG shower.  You may shave your face/neck. Please follow these instructions carefully:  1.  Shower with CHG Soap the night before surgery and the  morning of Surgery.  2.  If you choose to wash your hair, wash your hair first as usual with your  normal  shampoo.  3.  After you shampoo, rinse your hair and body thoroughly to remove the  shampoo.                           4.  Use CHG as you would any other liquid soap.  You can apply chg directly  to the skin and wash                       Gently with a scrungie or clean washcloth.  5.  Apply the CHG Soap to your body ONLY FROM THE NECK DOWN.   Do not use on face/ open                           Wound or open sores. Avoid contact with eyes, ears mouth and genitals (private parts).                       Wash face,  Genitals (private parts) with your normal soap.             6.  Wash thoroughly, paying special attention to the area where your surgery  will be performed.  7.  Thoroughly rinse your body with warm water from the neck down.  8.  DO NOT shower/wash with your normal soap after using and rinsing off  the CHG Soap.                9.  Pat yourself dry with a clean towel.            10.  Wear clean pajamas.            11.  Place clean sheets on your bed the night of your first shower and do not  sleep with pets. Day  of Surgery : Do not apply any lotions/deodorants the morning of surgery.  Please wear clean clothes to the hospital/surgery center.  FAILURE TO FOLLOW THESE INSTRUCTIONS MAY RESULT IN THE CANCELLATION OF YOUR SURGERY PATIENT SIGNATURE_________________________________  NURSE SIGNATURE__________________________________  ________________________________________________________________________

## 2021-01-09 ENCOUNTER — Encounter (HOSPITAL_COMMUNITY)
Admission: RE | Admit: 2021-01-09 | Discharge: 2021-01-09 | Disposition: A | Discharge status: Home / Self Care | Payer: No Typology Code available for payment source | Source: Ambulatory Visit | Attending: Surgery | Admitting: Surgery

## 2021-01-09 ENCOUNTER — Other Ambulatory Visit: Payer: Self-pay

## 2021-01-09 ENCOUNTER — Encounter (HOSPITAL_COMMUNITY): Payer: Self-pay

## 2021-01-09 DIAGNOSIS — Z01818 Encounter for other preprocedural examination: Secondary | ICD-10-CM | POA: Insufficient documentation

## 2021-01-09 HISTORY — DX: Pneumonia, unspecified organism: J18.9

## 2021-01-09 HISTORY — DX: Dyspnea, unspecified: R06.00

## 2021-01-09 HISTORY — DX: Essential (primary) hypertension: I10

## 2021-01-09 LAB — CBC
HCT: 38.3 % (ref 36.0–46.0)
Hemoglobin: 12.3 g/dL (ref 12.0–15.0)
MCH: 30.1 pg (ref 26.0–34.0)
MCHC: 32.1 g/dL (ref 30.0–36.0)
MCV: 93.9 fL (ref 80.0–100.0)
Platelets: 260 10*3/uL (ref 150–400)
RBC: 4.08 MIL/uL (ref 3.87–5.11)
RDW: 13.2 % (ref 11.5–15.5)
WBC: 5.4 10*3/uL (ref 4.0–10.5)
nRBC: 0 % (ref 0.0–0.2)

## 2021-01-09 NOTE — Progress Notes (Signed)
COVID Vaccine Completed: Yes Date COVID Vaccine completed: 12/22/20 @nd . COVID vaccine manufacturer: Pfizer   PCP - Dr. Edgardo Roys Cardiologist -   Chest x-ray -  EKG -  Stress Test -  ECHO -  Cardiac Cath -  Pacemaker/ICD device last checked:  Sleep Study -  CPAP -   Fasting Blood Sugar -  Checks Blood Sugar _____ times a day  Blood Thinner Instructions: Aspirin Instructions: Last Dose:  Anesthesia review: Hx: DVT,PE  Patient denies shortness of breath, fever, cough and chest pain at PAT appointment   Patient verbalized understanding of instructions that were given to them at the PAT appointment. Patient was also instructed that they will need to review over the PAT instructions again at home before surgery.

## 2021-01-10 ENCOUNTER — Telehealth: Payer: Self-pay | Admitting: Internal Medicine

## 2021-01-10 NOTE — Telephone Encounter (Signed)
Patient dropped off FMLA paperwork - given to Dr. Maple Hudson to sign -pr

## 2021-01-14 ENCOUNTER — Other Ambulatory Visit (HOSPITAL_COMMUNITY)
Admission: RE | Admit: 2021-01-14 | Discharge: 2021-01-14 | Disposition: A | Discharge status: Home / Self Care | Payer: No Typology Code available for payment source | Source: Ambulatory Visit | Attending: Surgery | Admitting: Surgery

## 2021-01-14 DIAGNOSIS — Z01812 Encounter for preprocedural laboratory examination: Secondary | ICD-10-CM | POA: Diagnosis not present

## 2021-01-14 DIAGNOSIS — Z20822 Contact with and (suspected) exposure to covid-19: Secondary | ICD-10-CM | POA: Diagnosis not present

## 2021-01-14 LAB — SARS CORONAVIRUS 2 (TAT 6-24 HRS): SARS Coronavirus 2: NEGATIVE

## 2021-01-14 NOTE — Telephone Encounter (Signed)
Rec'd forms back from [facility] - sent email to Marisue Ivan to drop charge. -pr

## 2021-01-15 MED ORDER — BUPIVACAINE LIPOSOME 1.3 % IJ SUSP
20.0000 mL | Freq: Once | INTRAMUSCULAR | Status: DC
  Filled 2021-01-15: qty 20

## 2021-01-16 ENCOUNTER — Encounter (HOSPITAL_COMMUNITY): Payer: Self-pay | Admitting: Surgery

## 2021-01-16 ENCOUNTER — Ambulatory Visit (HOSPITAL_COMMUNITY): Payer: No Typology Code available for payment source | Admitting: Certified Registered Nurse Anesthetist

## 2021-01-16 ENCOUNTER — Other Ambulatory Visit: Payer: Self-pay

## 2021-01-16 ENCOUNTER — Observation Stay (HOSPITAL_COMMUNITY)
Admission: RE | Admit: 2021-01-16 | Discharge: 2021-01-17 | Disposition: A | Discharge status: Home / Self Care | Payer: No Typology Code available for payment source | Source: Ambulatory Visit | Attending: Surgery | Admitting: Surgery

## 2021-01-16 ENCOUNTER — Encounter (HOSPITAL_COMMUNITY)
Admission: RE | Disposition: A | Discharge status: Home / Self Care | Payer: Self-pay | Source: Ambulatory Visit | Attending: Surgery

## 2021-01-16 DIAGNOSIS — I10 Essential (primary) hypertension: Secondary | ICD-10-CM | POA: Insufficient documentation

## 2021-01-16 DIAGNOSIS — K4020 Bilateral inguinal hernia, without obstruction or gangrene, not specified as recurrent: Secondary | ICD-10-CM

## 2021-01-16 DIAGNOSIS — K436 Other and unspecified ventral hernia with obstruction, without gangrene: Secondary | ICD-10-CM

## 2021-01-16 DIAGNOSIS — E669 Obesity, unspecified: Secondary | ICD-10-CM | POA: Diagnosis present

## 2021-01-16 DIAGNOSIS — K429 Umbilical hernia without obstruction or gangrene: Secondary | ICD-10-CM | POA: Diagnosis present

## 2021-01-16 DIAGNOSIS — Z79899 Other long term (current) drug therapy: Secondary | ICD-10-CM | POA: Insufficient documentation

## 2021-01-16 DIAGNOSIS — J4540 Moderate persistent asthma, uncomplicated: Secondary | ICD-10-CM | POA: Insufficient documentation

## 2021-01-16 DIAGNOSIS — Z419 Encounter for procedure for purposes other than remedying health state, unspecified: Secondary | ICD-10-CM

## 2021-01-16 DIAGNOSIS — K420 Umbilical hernia with obstruction, without gangrene: Secondary | ICD-10-CM | POA: Diagnosis not present

## 2021-01-16 HISTORY — PX: VENTRAL HERNIA REPAIR: SHX424

## 2021-01-16 HISTORY — PX: INGUINAL HERNIA REPAIR: SHX194

## 2021-01-16 SURGERY — REPAIR, HERNIA, VENTRAL, LAPAROSCOPIC
Anesthesia: General | Site: Abdomen

## 2021-01-16 MED ORDER — IBANDRONATE SODIUM 150 MG PO TABS: 150.0000 mg | ORAL_TABLET | ORAL | Status: DC

## 2021-01-16 MED ORDER — POLYETHYLENE GLYCOL 3350 17 G PO PACK: 17.0000 g | PACK | Freq: Two times a day (BID) | ORAL | Status: DC | PRN

## 2021-01-16 MED ORDER — PROPOFOL 10 MG/ML IV BOLUS
INTRAVENOUS | Status: DC | PRN
  Administered 2021-01-16: 150 mg via INTRAVENOUS

## 2021-01-16 MED ORDER — SUGAMMADEX SODIUM 200 MG/2ML IV SOLN
INTRAVENOUS | Status: DC | PRN
  Administered 2021-01-16: 200 mg via INTRAVENOUS

## 2021-01-16 MED ORDER — HYDROCHLOROTHIAZIDE 12.5 MG PO CAPS
12.5000 mg | ORAL_CAPSULE | Freq: Every morning | ORAL | Status: DC
  Administered 2021-01-17: 12.5 mg via ORAL
  Filled 2021-01-16: qty 1

## 2021-01-16 MED ORDER — EPHEDRINE 5 MG/ML INJ
INTRAVENOUS | Status: AC
  Filled 2021-01-16: qty 10

## 2021-01-16 MED ORDER — ENOXAPARIN SODIUM 40 MG/0.4ML IJ SOSY: 40.0000 mg | PREFILLED_SYRINGE | INTRAMUSCULAR | Status: DC

## 2021-01-16 MED ORDER — PHENOL 1.4 % MT LIQD
2.0000 | OROMUCOSAL | Status: DC | PRN
  Filled 2021-01-16: qty 177

## 2021-01-16 MED ORDER — FENTANYL CITRATE (PF) 250 MCG/5ML IJ SOLN
INTRAMUSCULAR | Status: AC
  Filled 2021-01-16: qty 5

## 2021-01-16 MED ORDER — LIDOCAINE 2% (20 MG/ML) 5 ML SYRINGE
INTRAMUSCULAR | Status: DC | PRN
  Administered 2021-01-16: 1.5 mg/kg/h via INTRAVENOUS

## 2021-01-16 MED ORDER — ROCURONIUM BROMIDE 10 MG/ML (PF) SYRINGE
PREFILLED_SYRINGE | INTRAVENOUS | Status: AC
  Filled 2021-01-16: qty 10

## 2021-01-16 MED ORDER — HYDROMORPHONE HCL 1 MG/ML IJ SOLN
INTRAMUSCULAR | Status: AC
  Filled 2021-01-16: qty 1

## 2021-01-16 MED ORDER — EPHEDRINE SULFATE-NACL 50-0.9 MG/10ML-% IV SOSY
PREFILLED_SYRINGE | INTRAVENOUS | Status: DC | PRN
  Administered 2021-01-16 (×2): 5 mg via INTRAVENOUS

## 2021-01-16 MED ORDER — DEXAMETHASONE SODIUM PHOSPHATE 10 MG/ML IJ SOLN
INTRAMUSCULAR | Status: AC
  Filled 2021-01-16: qty 1

## 2021-01-16 MED ORDER — BUPIVACAINE-EPINEPHRINE (PF) 0.25% -1:200000 IJ SOLN
INTRAMUSCULAR | Status: AC
  Filled 2021-01-16: qty 60

## 2021-01-16 MED ORDER — METOPROLOL TARTRATE 5 MG/5ML IV SOLN: 5.0000 mg | Freq: Four times a day (QID) | INTRAVENOUS | Status: DC | PRN

## 2021-01-16 MED ORDER — MENTHOL 3 MG MT LOZG: 1.0000 | LOZENGE | OROMUCOSAL | Status: DC | PRN

## 2021-01-16 MED ORDER — FENTANYL CITRATE (PF) 100 MCG/2ML IJ SOLN: 25.0000 ug | INTRAMUSCULAR | Status: DC | PRN

## 2021-01-16 MED ORDER — SIMETHICONE 40 MG/0.6ML PO SUSP
80.0000 mg | Freq: Four times a day (QID) | ORAL | Status: DC | PRN
  Filled 2021-01-16: qty 1.2

## 2021-01-16 MED ORDER — METHOCARBAMOL 750 MG PO TABS: 750.0000 mg | ORAL_TABLET | Freq: Four times a day (QID) | ORAL | 2 refills | Status: DC | PRN

## 2021-01-16 MED ORDER — ACETAMINOPHEN 500 MG PO TABS
1000.0000 mg | ORAL_TABLET | Freq: Four times a day (QID) | ORAL | Status: DC
  Administered 2021-01-16 – 2021-01-17 (×2): 1000 mg via ORAL
  Filled 2021-01-16 (×2): qty 2

## 2021-01-16 MED ORDER — CHLORHEXIDINE GLUCONATE CLOTH 2 % EX PADS: 6.0000 | MEDICATED_PAD | Freq: Once | CUTANEOUS | Status: DC

## 2021-01-16 MED ORDER — METHOCARBAMOL 500 MG PO TABS: 1000.0000 mg | ORAL_TABLET | Freq: Four times a day (QID) | ORAL | Status: DC | PRN

## 2021-01-16 MED ORDER — LACTATED RINGERS IV SOLN: INTRAVENOUS | Status: DC

## 2021-01-16 MED ORDER — ATORVASTATIN CALCIUM 40 MG PO TABS
40.0000 mg | ORAL_TABLET | Freq: Every day | ORAL | Status: DC
  Administered 2021-01-16: 40 mg via ORAL
  Filled 2021-01-16: qty 1

## 2021-01-16 MED ORDER — ALBUTEROL SULFATE HFA 108 (90 BASE) MCG/ACT IN AERS: 2.0000 | INHALATION_SPRAY | Freq: Four times a day (QID) | RESPIRATORY_TRACT | Status: DC | PRN

## 2021-01-16 MED ORDER — ROCURONIUM BROMIDE 10 MG/ML (PF) SYRINGE
PREFILLED_SYRINGE | INTRAVENOUS | Status: DC | PRN
  Administered 2021-01-16: 20 mg via INTRAVENOUS
  Administered 2021-01-16: 60 mg via INTRAVENOUS
  Administered 2021-01-16: 10 mg via INTRAVENOUS

## 2021-01-16 MED ORDER — MOMETASONE FURO-FORMOTEROL FUM 200-5 MCG/ACT IN AERO
2.0000 | INHALATION_SPRAY | Freq: Two times a day (BID) | RESPIRATORY_TRACT | Status: DC
  Administered 2021-01-16 – 2021-01-17 (×2): 2 via RESPIRATORY_TRACT
  Filled 2021-01-16: qty 8.8

## 2021-01-16 MED ORDER — SODIUM CHLORIDE 0.9% FLUSH: 3.0000 mL | INTRAVENOUS | Status: DC | PRN

## 2021-01-16 MED ORDER — MONTELUKAST SODIUM 10 MG PO TABS
10.0000 mg | ORAL_TABLET | Freq: Every day | ORAL | Status: DC
  Administered 2021-01-16: 10 mg via ORAL
  Filled 2021-01-16: qty 1

## 2021-01-16 MED ORDER — SODIUM CHLORIDE 0.9% FLUSH: 3.0000 mL | Freq: Two times a day (BID) | INTRAVENOUS | Status: DC

## 2021-01-16 MED ORDER — ALUM & MAG HYDROXIDE-SIMETH 200-200-20 MG/5ML PO SUSP: 30.0000 mL | Freq: Four times a day (QID) | ORAL | Status: DC | PRN

## 2021-01-16 MED ORDER — KETAMINE HCL 10 MG/ML IJ SOLN
INTRAMUSCULAR | Status: AC
  Filled 2021-01-16: qty 1

## 2021-01-16 MED ORDER — GLYCOPYRROLATE PF 0.2 MG/ML IJ SOSY
PREFILLED_SYRINGE | INTRAMUSCULAR | Status: AC
  Filled 2021-01-16: qty 1

## 2021-01-16 MED ORDER — SODIUM CHLORIDE 0.9 % IV SOLN: 250.0000 mL | INTRAVENOUS | Status: DC | PRN

## 2021-01-16 MED ORDER — MAGIC MOUTHWASH
15.0000 mL | Freq: Four times a day (QID) | ORAL | Status: DC | PRN
  Filled 2021-01-16: qty 15

## 2021-01-16 MED ORDER — ALBUTEROL SULFATE (2.5 MG/3ML) 0.083% IN NEBU: 2.5000 mg | INHALATION_SOLUTION | Freq: Four times a day (QID) | RESPIRATORY_TRACT | Status: DC | PRN

## 2021-01-16 MED ORDER — PROMETHAZINE HCL 25 MG/ML IJ SOLN: 6.2500 mg | INTRAMUSCULAR | Status: DC | PRN

## 2021-01-16 MED ORDER — PHENYLEPHRINE 40 MCG/ML (10ML) SYRINGE FOR IV PUSH (FOR BLOOD PRESSURE SUPPORT)
PREFILLED_SYRINGE | INTRAVENOUS | Status: DC | PRN
  Administered 2021-01-16 (×2): 80 ug via INTRAVENOUS

## 2021-01-16 MED ORDER — ACETAMINOPHEN 650 MG RE SUPP: 650.0000 mg | RECTAL | Status: DC | PRN

## 2021-01-16 MED ORDER — LIDOCAINE HCL (CARDIAC) PF 100 MG/5ML IV SOSY
PREFILLED_SYRINGE | INTRAVENOUS | Status: DC | PRN
  Administered 2021-01-16: 100 mg via INTRAVENOUS

## 2021-01-16 MED ORDER — ACETAMINOPHEN 500 MG PO TABS: 500.0000 mg | ORAL_TABLET | Freq: Four times a day (QID) | ORAL | Status: DC | PRN

## 2021-01-16 MED ORDER — SODIUM CHLORIDE 0.9% FLUSH
3.0000 mL | INTRAVENOUS | Status: DC | PRN
Start: 2021-01-16 — End: 2021-01-16

## 2021-01-16 MED ORDER — METHOCARBAMOL 1000 MG/10ML IJ SOLN
1000.0000 mg | Freq: Four times a day (QID) | INTRAVENOUS | Status: DC | PRN
  Filled 2021-01-16: qty 10

## 2021-01-16 MED ORDER — MAGNESIUM HYDROXIDE 400 MG/5ML PO SUSP: 30.0000 mL | Freq: Every day | ORAL | Status: DC | PRN

## 2021-01-16 MED ORDER — METHOCARBAMOL 500 MG PO TABS: 500.0000 mg | ORAL_TABLET | Freq: Four times a day (QID) | ORAL | Status: DC | PRN

## 2021-01-16 MED ORDER — PROCHLORPERAZINE EDISYLATE 10 MG/2ML IJ SOLN: 5.0000 mg | INTRAMUSCULAR | Status: DC | PRN

## 2021-01-16 MED ORDER — GABAPENTIN 300 MG PO CAPS
300.0000 mg | ORAL_CAPSULE | ORAL | Status: AC
  Administered 2021-01-16: 300 mg via ORAL
  Filled 2021-01-16: qty 1

## 2021-01-16 MED ORDER — LACTATED RINGERS IR SOLN
Status: DC | PRN
  Administered 2021-01-16: 1000 mL

## 2021-01-16 MED ORDER — CEFAZOLIN SODIUM-DEXTROSE 2-4 GM/100ML-% IV SOLN
2.0000 g | Freq: Three times a day (TID) | INTRAVENOUS | Status: AC
  Administered 2021-01-16: 2 g via INTRAVENOUS
  Filled 2021-01-16: qty 100

## 2021-01-16 MED ORDER — ALBUTEROL SULFATE (5 MG/ML) 0.5% IN NEBU: 2.5000 mg | INHALATION_SOLUTION | Freq: Four times a day (QID) | RESPIRATORY_TRACT | Status: DC | PRN

## 2021-01-16 MED ORDER — VITAMIN D3 25 MCG (1000 UNIT) PO TABS
5000.0000 [IU] | ORAL_TABLET | Freq: Every morning | ORAL | Status: DC
  Administered 2021-01-17: 5000 [IU] via ORAL
  Filled 2021-01-16: qty 5

## 2021-01-16 MED ORDER — ORAL CARE MOUTH RINSE: 15.0000 mL | Freq: Once | OROMUCOSAL | Status: AC

## 2021-01-16 MED ORDER — ACETAMINOPHEN 10 MG/ML IV SOLN: 1000.0000 mg | Freq: Once | INTRAVENOUS | Status: DC | PRN

## 2021-01-16 MED ORDER — LIDOCAINE HCL 2 % IJ SOLN
INTRAMUSCULAR | Status: AC
  Filled 2021-01-16: qty 20

## 2021-01-16 MED ORDER — DEXAMETHASONE SODIUM PHOSPHATE 10 MG/ML IJ SOLN
INTRAMUSCULAR | Status: DC | PRN
  Administered 2021-01-16: 10 mg via INTRAVENOUS

## 2021-01-16 MED ORDER — KETAMINE HCL 10 MG/ML IJ SOLN
INTRAMUSCULAR | Status: DC | PRN
  Administered 2021-01-16: 30 mg via INTRAVENOUS

## 2021-01-16 MED ORDER — DIPHENHYDRAMINE HCL 50 MG/ML IJ SOLN: 12.5000 mg | Freq: Four times a day (QID) | INTRAMUSCULAR | Status: DC | PRN

## 2021-01-16 MED ORDER — CHLORHEXIDINE GLUCONATE 0.12 % MT SOLN
15.0000 mL | Freq: Once | OROMUCOSAL | Status: AC
  Administered 2021-01-16: 15 mL via OROMUCOSAL

## 2021-01-16 MED ORDER — CEFAZOLIN SODIUM-DEXTROSE 2-4 GM/100ML-% IV SOLN
2.0000 g | INTRAVENOUS | Status: AC
  Administered 2021-01-16: 2 g via INTRAVENOUS
  Filled 2021-01-16: qty 100

## 2021-01-16 MED ORDER — PROPOFOL 10 MG/ML IV BOLUS
INTRAVENOUS | Status: AC
  Filled 2021-01-16: qty 20

## 2021-01-16 MED ORDER — ENSURE PRE-SURGERY PO LIQD
296.0000 mL | Freq: Once | ORAL | Status: DC
  Filled 2021-01-16: qty 296

## 2021-01-16 MED ORDER — DIPHENHYDRAMINE HCL 12.5 MG/5ML PO ELIX: 12.5000 mg | ORAL_SOLUTION | Freq: Four times a day (QID) | ORAL | Status: DC | PRN

## 2021-01-16 MED ORDER — DIPHENHYDRAMINE HCL 50 MG/ML IJ SOLN
12.5000 mg | Freq: Four times a day (QID) | INTRAMUSCULAR | Status: DC | PRN
Start: 2021-01-16 — End: 2021-01-17

## 2021-01-16 MED ORDER — CALCIUM CARBONATE-VITAMIN D 500-200 MG-UNIT PO TABS
1.0000 | ORAL_TABLET | Freq: Every morning | ORAL | Status: DC
  Administered 2021-01-17: 1 via ORAL
  Filled 2021-01-16 (×2): qty 1

## 2021-01-16 MED ORDER — BISACODYL 10 MG RE SUPP
10.0000 mg | Freq: Every day | RECTAL | Status: DC | PRN
Start: 2021-01-16 — End: 2021-01-17

## 2021-01-16 MED ORDER — ENSURE PRE-SURGERY PO LIQD: 592.0000 mL | Freq: Once | ORAL | Status: DC

## 2021-01-16 MED ORDER — PHENYLEPHRINE 40 MCG/ML (10ML) SYRINGE FOR IV PUSH (FOR BLOOD PRESSURE SUPPORT)
PREFILLED_SYRINGE | INTRAVENOUS | Status: AC
  Filled 2021-01-16: qty 20

## 2021-01-16 MED ORDER — BUPIVACAINE LIPOSOME 1.3 % IJ SUSP
INTRAMUSCULAR | Status: DC | PRN
  Administered 2021-01-16: 20 mL

## 2021-01-16 MED ORDER — OXYCODONE HCL 5 MG PO TABS: 5.0000 mg | ORAL_TABLET | ORAL | Status: DC | PRN

## 2021-01-16 MED ORDER — FENTANYL CITRATE (PF) 250 MCG/5ML IJ SOLN
INTRAMUSCULAR | Status: DC | PRN
  Administered 2021-01-16 (×2): 50 ug via INTRAVENOUS
  Administered 2021-01-16: 100 ug via INTRAVENOUS
  Administered 2021-01-16 (×3): 50 ug via INTRAVENOUS

## 2021-01-16 MED ORDER — SODIUM CHLORIDE 0.9 % IR SOLN
Status: DC | PRN
  Administered 2021-01-16: 3000 mL

## 2021-01-16 MED ORDER — MIDAZOLAM HCL 2 MG/2ML IJ SOLN
INTRAMUSCULAR | Status: AC
  Filled 2021-01-16: qty 2

## 2021-01-16 MED ORDER — SIMETHICONE 80 MG PO CHEW: 40.0000 mg | CHEWABLE_TABLET | Freq: Four times a day (QID) | ORAL | Status: DC | PRN

## 2021-01-16 MED ORDER — FLUTICASONE PROPIONATE 50 MCG/ACT NA SUSP
2.0000 | Freq: Every day | NASAL | Status: DC
  Filled 2021-01-16: qty 16

## 2021-01-16 MED ORDER — OXYCODONE HCL 5 MG PO TABS: 5.0000 mg | ORAL_TABLET | Freq: Four times a day (QID) | ORAL | 0 refills | Status: DC | PRN

## 2021-01-16 MED ORDER — HYDROMORPHONE HCL 1 MG/ML IJ SOLN
0.2500 mg | INTRAMUSCULAR | Status: DC | PRN
  Administered 2021-01-16: 0.25 mg via INTRAVENOUS

## 2021-01-16 MED ORDER — ACETAMINOPHEN 325 MG PO TABS: 650.0000 mg | ORAL_TABLET | ORAL | Status: DC | PRN

## 2021-01-16 MED ORDER — OXYCODONE HCL 5 MG PO TABS
5.0000 mg | ORAL_TABLET | ORAL | Status: DC | PRN
  Administered 2021-01-16 – 2021-01-17 (×2): 10 mg via ORAL
  Filled 2021-01-16 (×2): qty 2

## 2021-01-16 MED ORDER — LIP MEDEX EX OINT
1.0000 "application " | TOPICAL_OINTMENT | Freq: Two times a day (BID) | CUTANEOUS | Status: DC
  Administered 2021-01-16: 1 via TOPICAL

## 2021-01-16 MED ORDER — LIDOCAINE 2% (20 MG/ML) 5 ML SYRINGE
INTRAMUSCULAR | Status: AC
  Filled 2021-01-16: qty 5

## 2021-01-16 MED ORDER — ONDANSETRON HCL 4 MG/2ML IJ SOLN
INTRAMUSCULAR | Status: DC | PRN
  Administered 2021-01-16: 4 mg via INTRAVENOUS

## 2021-01-16 MED ORDER — BUPIVACAINE-EPINEPHRINE 0.25% -1:200000 IJ SOLN
INTRAMUSCULAR | Status: DC | PRN
  Administered 2021-01-16: 60 mL

## 2021-01-16 MED ORDER — MEPERIDINE HCL 50 MG/ML IJ SOLN: 6.2500 mg | INTRAMUSCULAR | Status: DC | PRN

## 2021-01-16 MED ORDER — ONDANSETRON HCL 4 MG/2ML IJ SOLN
INTRAMUSCULAR | Status: AC
  Filled 2021-01-16: qty 2

## 2021-01-16 MED ORDER — LACTATED RINGERS IV SOLN: INTRAVENOUS | Status: AC

## 2021-01-16 MED ORDER — SODIUM CHLORIDE 0.9 % IV SOLN: Freq: Three times a day (TID) | INTRAVENOUS | Status: DC | PRN

## 2021-01-16 MED ORDER — FENTANYL CITRATE (PF) 100 MCG/2ML IJ SOLN
INTRAMUSCULAR | Status: AC
  Filled 2021-01-16: qty 2

## 2021-01-16 MED ORDER — ACETAMINOPHEN 500 MG PO TABS
1000.0000 mg | ORAL_TABLET | ORAL | Status: AC
  Administered 2021-01-16: 1000 mg via ORAL
  Filled 2021-01-16: qty 2

## 2021-01-16 MED ORDER — 0.9 % SODIUM CHLORIDE (POUR BTL) OPTIME
TOPICAL | Status: DC | PRN
  Administered 2021-01-16: 1000 mL

## 2021-01-16 MED ORDER — MIDAZOLAM HCL 2 MG/2ML IJ SOLN
INTRAMUSCULAR | Status: DC | PRN
  Administered 2021-01-16: 2 mg via INTRAVENOUS

## 2021-01-16 SURGICAL SUPPLY — 55 items
APL PRP STRL LF DISP 70% ISPRP (MISCELLANEOUS) ×2
APPLIER CLIP 5 13 M/L LIGAMAX5 (MISCELLANEOUS)
APR CLP MED LRG 5 ANG JAW (MISCELLANEOUS)
BINDER ABDOMINAL 12 ML 46-62 (SOFTGOODS) IMPLANT
CABLE HIGH FREQUENCY MONO STRZ (ELECTRODE) ×4 IMPLANT
CHLORAPREP W/TINT 26 (MISCELLANEOUS) ×4 IMPLANT
CLIP APPLIE 5 13 M/L LIGAMAX5 (MISCELLANEOUS) IMPLANT
CLOSURE STERI-STRIP 1/2X4 (GAUZE/BANDAGES/DRESSINGS) ×1
CLOSURE WOUND 1/2 X4 (GAUZE/BANDAGES/DRESSINGS) ×2
CLSR STERI-STRIP ANTIMIC 1/2X4 (GAUZE/BANDAGES/DRESSINGS) ×1 IMPLANT
COVER SURGICAL LIGHT HANDLE (MISCELLANEOUS) ×4 IMPLANT
COVER WAND RF STERILE (DRAPES) ×4 IMPLANT
DECANTER SPIKE VIAL GLASS SM (MISCELLANEOUS) ×4 IMPLANT
DEVICE SECURE STRAP 25 ABSORB (INSTRUMENTS) ×2 IMPLANT
DEVICE TROCAR PUNCTURE CLOSURE (ENDOMECHANICALS) ×4 IMPLANT
DRAPE WARM FLUID 44X44 (DRAPES) ×4 IMPLANT
DRSG TEGADERM 2-3/8X2-3/4 SM (GAUZE/BANDAGES/DRESSINGS) ×12 IMPLANT
DRSG TEGADERM 4X4.75 (GAUZE/BANDAGES/DRESSINGS) ×4 IMPLANT
ELECT REM PT RETURN 15FT ADLT (MISCELLANEOUS) ×4 IMPLANT
GAUZE SPONGE 2X2 8PLY STRL LF (GAUZE/BANDAGES/DRESSINGS) ×2 IMPLANT
GLOVE SURG LTX SZ8 (GLOVE) ×4 IMPLANT
GLOVE SURG UNDER LTX SZ8 (GLOVE) ×4 IMPLANT
GOWN STRL REUS W/TWL XL LVL3 (GOWN DISPOSABLE) ×8 IMPLANT
IRRIG SUCT STRYKERFLOW 2 WTIP (MISCELLANEOUS)
IRRIGATION SUCT STRKRFLW 2 WTP (MISCELLANEOUS) IMPLANT
KIT BASIN OR (CUSTOM PROCEDURE TRAY) ×4 IMPLANT
KIT TURNOVER KIT A (KITS) ×4 IMPLANT
MARKER SKIN DUAL TIP RULER LAB (MISCELLANEOUS) ×4 IMPLANT
MESH ULTRAPRO 6X6 15CM15CM (Mesh General) ×6 IMPLANT
MESH VENTRALIGHT ST 7X9N (Mesh General) ×2 IMPLANT
NDL INSUFFLATION 14GA 120MM (NEEDLE) IMPLANT
NDL SPNL 22GX3.5 QUINCKE BK (NEEDLE) IMPLANT
NEEDLE INSUFFLATION 14GA 120MM (NEEDLE) IMPLANT
NEEDLE SPNL 22GX3.5 QUINCKE BK (NEEDLE) IMPLANT
PAD POSITIONING PINK XL (MISCELLANEOUS) ×4 IMPLANT
PENCIL SMOKE EVACUATOR (MISCELLANEOUS) IMPLANT
SCISSORS LAP 5X35 DISP (ENDOMECHANICALS) ×4 IMPLANT
SET TUBE SMOKE EVAC HIGH FLOW (TUBING) ×4 IMPLANT
SLEEVE ADV FIXATION 5X100MM (TROCAR) ×4 IMPLANT
SPONGE GAUZE 2X2 STER 10/PKG (GAUZE/BANDAGES/DRESSINGS) ×2
STRIP CLOSURE SKIN 1/2X4 (GAUZE/BANDAGES/DRESSINGS) ×6 IMPLANT
SUT MNCRL AB 4-0 PS2 18 (SUTURE) ×4 IMPLANT
SUT PDS AB 1 CT1 27 (SUTURE) ×8 IMPLANT
SUT PROLENE 1 CT 1 30 (SUTURE) ×20 IMPLANT
SUT VIC AB 2-0 SH 27 (SUTURE) ×8
SUT VIC AB 2-0 SH 27X BRD (SUTURE) IMPLANT
SUT VICRYL 0 UR6 27IN ABS (SUTURE) ×4 IMPLANT
TACKER 5MM HERNIA 3.5CML NAB (ENDOMECHANICALS) IMPLANT
TOWEL OR 17X26 10 PK STRL BLUE (TOWEL DISPOSABLE) ×4 IMPLANT
TOWEL OR NON WOVEN STRL DISP B (DISPOSABLE) ×4 IMPLANT
TRAY LAPAROSCOPIC (CUSTOM PROCEDURE TRAY) ×4 IMPLANT
TROCAR ADV FIXATION 11X100MM (TROCAR) IMPLANT
TROCAR ADV FIXATION 5X100MM (TROCAR) ×4 IMPLANT
TROCAR BLADELESS OPT 5 100 (ENDOMECHANICALS) ×4 IMPLANT
TROCAR XCEL BLUNT TIP 100MML (ENDOMECHANICALS) ×4 IMPLANT

## 2021-01-16 NOTE — H&P (Signed)
01/16/2021  Andrea [name]  Patient #: [id number] DOB: 05-14-1951 Divorced / Language: [place] / Race: White Female   Patient Care Team: [name], MD as PCP - General (Family Medicine) Waymon Budge, MD as Consulting Physician (Pulmonary Disease) Karie Soda, MD as Consulting Physician (General Surgery)  ` Patient sent for surgical consultation at the request of [name]   Chief Complaint: Hernia ` ` The patient is a pleasant woman who is felt a lump above her bellybutton since last year.  It occasionally is bothersome if she turns her twists or bumps it.  She will get rather nauseated with it.  She mentioned this to her primary care physician on her annual visit in January.  Exam suspicious an ultrasound even more suspicious for superficial umbilical hernia.  Possible small bowel containing.  We'll consultation offered.  Patient has moderate asthma that seems to be controlled with her Advair and Singulair.  Fall by Dr. Jetty Duhamel with [facility].  Uses inhalers occasionally.  Usually not too bad until the spring time with pollen.  Needs a steroid taper sometimes, but she has not needed one for the past 3 months.  She does have her walks with her daughter slowly but steadily.  She does have some constipation, moving her bowels a couple times a week.  She's tried increased fiber.  She does not smoke.  She is not diabetic.  She had a bladder repair through suprapubic incision and a vaginal hysterectomy/oophorectomy.  She had a sister with ovarian cancer.  Ready for surgery.  No new events   (Review of systems as stated in this history (HPI) or in the review of systems.  Otherwise all other 12 point ROS are negative) ` ` ###########################################`   This patient encounter took 30 minutes today to perform the following: obtain history, perform exam, review outside records, interpret tests & imaging, counsel the patient on their diagnosis; and,  document this encounter, including findings & plan in the electronic health record (EHR).     Past Surgical History (Chanel Lonni Fix, CMA; 10/22/2020 9:45 AM) Hysterectomy (not due to cancer) - Complete    Diagnostic Studies History (Chanel Lonni Fix, CMA; 10/22/2020 9:45 AM) Colonoscopy  never Mammogram  within last year   Allergies (Chanel Lonni Fix, CMA; 10/22/2020 9:47 AM) Moxifloxacin HCl *CHEMICALS*  Rofecoxib *ANALGESICS - ANTI-INFLAMMATORY*  Allergies Reconciled    Medication History (Chanel Lonni Fix, CMA; 10/22/2020 9:49 AM) Aleda Grana Allergy Relief  (50MCG/ACT Suspension, Nasal) Active. Advair Diskus  (500-50MCG/DOSE Aero Pow Br Act, Inhalation) Active. Atorvastatin Calcium  (40MG  Tablet, Oral) Active. hydroCHLOROthiazide  (12.5MG  Tablet, Oral) Active. Singulair  (Oral) Specific strength unknown - Active. Ventolin HFA  (108 (90 Base)MCG/ACT Aerosol Soln, Inhalation) Active. Furosemide  (Oral) Specific strength unknown - Active. Medications Reconciled    Social History , CMA; 10/22/2020 9:45 AM) Alcohol use  Occasional alcohol use. Caffeine use  Carbonated beverages, Tea. No drug use  Tobacco use  Never smoker.   Family History 10/24/2020, CMA; 10/22/2020 9:45 AM) Anesthetic complications  Mother. Arthritis  Mother. Bleeding disorder  Sister. Cerebrovascular Accident  Mother. Heart Disease  Mother. Heart disease in female family member before age 42  Heart disease in female family member before age 26  Hypertension  Mother. Migraine Headache  Daughter. Ovarian Cancer  Sister.   Pregnancy / Birth History 54, CMA; 10/22/2020 9:45 AM) Age at menarche  12 years. Age of menopause  [age] Length (months) of breastfeeding  3-6 Maternal age  82-25 Para  1   Other Problems (Chanel Lonni Fix, CMA; 10/22/2020 9:45 AM) Asthma  Back Pain  Bladder Problems  General anesthesia - complications  Hypercholesterolemia  Oophorectomy  Bilateral. Pulmonary Embolism / Blood  Clot in Legs  Transfusion history  Umbilical Hernia Repair          Review of Systems  General Not Present- Appetite Loss, Chills, Fatigue, Fever, Night Sweats, Weight Gain and Weight Loss. Skin Not Present- Change in Wart/Mole, Dryness, Hives, Jaundice, New Lesions, Non-Healing Wounds, Rash and Ulcer. HEENT Present- Seasonal Allergies and Sinus Pain. Not Present- Earache, Hearing Loss, Hoarseness, Nose Bleed, Oral Ulcers, Ringing in the Ears, Sore Throat, Visual Disturbances, Wears glasses/contact lenses and Yellow Eyes. Respiratory Present- Difficulty Breathing. Not Present- Bloody sputum, Chronic Cough, Snoring and Wheezing. Breast Not Present- Breast Mass, Breast Pain, Nipple Discharge and Skin Changes. Cardiovascular Present- Shortness of Breath and Swelling of Extremities. Not Present- Chest Pain, Difficulty Breathing Lying Down, Leg Cramps, Palpitations and Rapid Heart Rate. Gastrointestinal Present- Indigestion. Not Present- Abdominal Pain, Bloating, Bloody Stool, Change in Bowel Habits, Chronic diarrhea, Constipation, Difficulty Swallowing, Excessive gas, Gets full quickly at meals, Hemorrhoids, Nausea, Rectal Pain and Vomiting. Female Genitourinary Present- Nocturia and Urgency. Not Present- Frequency, Painful Urination and Pelvic Pain. Musculoskeletal Present- Back Pain. Not Present- Joint Pain, Joint Stiffness, Muscle Pain, Muscle Weakness and Swelling of Extremities. Neurological Not Present- Decreased Memory, Fainting, Headaches, Numbness, Seizures, Tingling, Tremor, Trouble walking and Weakness. Psychiatric Not Present- Anxiety, Bipolar, Change in Sleep Pattern, Depression, Fearful and Frequent crying.   Vitals (Chanel Nolan CMA; 10/22/2020 9:49 AM) 10/22/2020 9:49 AM Weight: 179.5 lb   Height: 63 in  Body Surface Area: 1.85 m   Body Mass Index: 31.8 kg/m   Temp.: 97.7 F    Pulse: 101 (Regular)         BP 118/72   Pulse 76   Temp 98.4 F (36.9 C) (Oral)   Resp 18    SpO2 98%  01/16/2021          Physical Exam   General Mental Status - Alert. General Appearance - Not in acute distress, Not Sickly. Orientation - Oriented X3. Hydration - Well hydrated. Voice - Normal.   Integumentary Global Assessment Upon inspection and palpation of skin surfaces of the - Axillae: non-tender, no inflammation or ulceration, no drainage. and Distribution of scalp and body hair is normal. General Characteristics Temperature - normal warmth is noted.   Head and Neck Head - normocephalic, atraumatic with no lesions or palpable masses. Face Global Assessment - atraumatic, no absence of expression. Neck Global Assessment - no abnormal movements, no bruit auscultated on the right, no bruit auscultated on the left, no decreased range of motion, non-tender. Trachea - midline. Thyroid Gland Characteristics - non-tender.   Eye Eyeball - Left - Extraocular movements intact, No Nystagmus - Left. Eyeball - Right - Extraocular movements intact, No Nystagmus - Right. Cornea - Left - No Hazy - Left. Cornea - Right - No Hazy - Right. Sclera/Conjunctiva - Left - No scleral icterus, No Discharge - Left. Sclera/Conjunctiva - Right - No scleral icterus, No Discharge - Right. Pupil - Left - Direct reaction to light normal. Pupil - Right - Direct reaction to light normal.   ENMT Ears Pinna - Left - no drainage observed, no generalized tenderness observed. Pinna - Right - no drainage observed, no generalized tenderness observed. Nose and Sinuses External Inspection of the Nose - no destructive lesion observed. Inspection of the nares - Left -  quiet respiration. Inspection of the nares - Right - quiet respiration. Mouth and Throat Lips - Upper Lip - no fissures observed, no pallor noted. Lower Lip - no fissures observed, no pallor noted. Nasopharynx - no discharge present. Oral Cavity/Oropharynx - Tongue - no dryness observed. Oral Mucosa - no cyanosis observed. Hypopharynx -  no evidence of airway distress observed.   Chest and Lung Exam Inspection Movements - Normal and Symmetrical. Accessory muscles - No use of accessory muscles in breathing. Palpation Palpation of the chest reveals - Non-tender. Auscultation Breath sounds - Normal and Clear.   Cardiovascular Auscultation Rhythm - Regular. Murmurs & Other Heart Sounds - Auscultation of the heart reveals - No Murmurs and No Systolic Clicks.   Abdomen Inspection Inspection of the abdomen reveals - No Visible peristalsis and No Abnormal pulsations. Umbilicus - No Bleeding, No Urine drainage. Palpation/Percussion Palpation and Percussion of the abdomen reveal - Soft, Non Tender, No Rebound tenderness, No Rigidity (guarding) and No Cutaneous hyperesthesia. Note:  Supraumbilical 5 x 4 cm mass sensitive partially reducible consistent with hernia.   Abdomen obese with panniculus but soft.  Not severely distended.  No other anterior abdominal wall hernias   Female Genitourinary Sexual Maturity Tanner 5 - Adult hair pattern. Note:  Pfannenstiel incisions well-healed.   Mild impulse that left lateral groin with cough suspicious for indirect inguinal hernia. No right-sided hernia obvious.   No vaginal bleeding nor discharge   Peripheral Vascular Upper Extremity Inspection - Left - No Cyanotic nailbeds - Left, Not Ischemic. Inspection - Right - No Cyanotic nailbeds - Right, Not Ischemic.   Neurologic Neurologic evaluation reveals  - normal attention span and ability to concentrate, able to name objects and repeat phrases. Appropriate fund of knowledge , normal sensation and normal coordination. Mental Status Affect - not angry, not paranoid. Cranial Nerves - Normal Bilaterally. Gait - Normal.   Neuropsychiatric Mental status exam performed with findings of - able to articulate well with normal speech/language, rate, volume and coherence, thought content normal with ability to perform basic computations  and apply abstract reasoning and no evidence of hallucinations, delusions, obsessions or homicidal/suicidal ideation.   Musculoskeletal Global Assessment Spine, Ribs and Pelvis - no instability, subluxation or laxity. Right Upper Extremity - no instability, subluxation or laxity.   Lymphatic Head & Neck   General Head & Neck Lymphatics: Bilateral - Description - No Localized lymphadenopathy. Axillary   General Axillary Region: Bilateral - Description - No Localized lymphadenopathy. Femoral & Inguinal   Generalized Femoral & Inguinal Lymphatics: Left - Description - No Localized lymphadenopathy. Right - Description - No Localized lymphadenopathy.       Assessment & Plan )(Ardeth SportsmanSteven C. 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 Some discomfort.   I think she would benefit from laparoscopic exploration and repair of hernias found. Would be aggressive in fixing any inguinal hernias if found as well to make sure that doesn't become an issue in the future.   She is interested in proceeding. However she like to wait until early summer such as June after some family issues or out of the way. She does not have any severe symptoms, some reasonable to wait.   Patient is anxious because she had a sibling who had ovarian cancer that was not caught until it was metastatic. Patient has already had a hysterectomy and bilateral oophrectomies, but I will do my do diligence  to be safe and double check.      LEFT INGUINAL HERNIA (K40.90)  Lap repair planned   Current Plans The anatomy & physiology of the abdominal wall and pelvic floor was discussed.  The pathophysiology of hernias in the inguinal and pelvic region was discussed.  Natural history risks such as progressive enlargement, pain, incarceration, and strangulation was discussed.   Contributors to complications such  as smoking, obesity, diabetes, prior surgery, etc were discussed.    I feel the risks of no intervention will lead to serious problems that outweigh the operative risks; therefore, I recommended surgery to reduce and repair the hernia.  I explained laparoscopic techniques with possible need for an open approach.  I noted usual use of mesh to patch and/or buttress hernia repair   Risks such as bleeding, infection, abscess, need for further treatment, heart attack, death, and other risks were discussed.  I noted a good likelihood this will help address the problem.   Goals of post-operative recovery were discussed as well.  Possibility that this will not correct all symptoms was explained.  I stressed the importance of low-impact activity, aggressive pain control, avoiding constipation, & not pushing through pain to minimize risk of post-operative chronic pain or injury. Possibility of reherniation was discussed.  We will work to minimize complications.     An educational handout further explaining the pathology & treatment options was given as well.  Questions were answered.  The patient expresses understanding & wishes to proceed with surgery.       MILD INTERMITTENT CHRONIC ASTHMA WITHOUT COMPLICATION (J45.20)   Ardeth Sportsman, MD, FACS, MASCRS   Gastrointestinal and Minimally Invasive Surgery   [facility] [address] [place], [place] [address] [phone] Fax [phone] Main/Paging   CONTACT INFORMATION: Weekday (9AM-5PM) concerns: Call [facility] main office at [phone] Weeknight (5PM-9AM) or Weekend/Holiday concerns: Check [email or web address] for General Surgery [facility] coverage (Please, do not use SecureChat as it is not reliable communication to operating surgeons for immediate patient care)

## 2021-01-16 NOTE — Discharge Instructions (Signed)
HERNIA REPAIR: POST OP INSTRUCTIONS  ######################################################################  EAT Gradually transition to a high fiber diet with a fiber supplement over the next few weeks after discharge.  Start with a pureed / full liquid diet (see below)  WALK Walk an hour a day.  Control your pain to do that.    CONTROL PAIN Control pain so that you can walk, sleep, tolerate sneezing/coughing, and go up/down stairs.  HAVE A BOWEL MOVEMENT DAILY Keep your bowels regular to avoid problems.  OK to try a laxative to override constipation.  OK to use an antidairrheal to slow down diarrhea.  Call if not better after 2 tries  CALL IF YOU HAVE PROBLEMS/CONCERNS Call if you are still struggling despite following these instructions. Call if you have concerns not answered by these instructions  ######################################################################    DIET: Follow a light bland diet & liquids the first 24 hours after arrival home, such as soup, liquids, starches, etc.  Be sure to drink plenty of fluids.  Quickly advance to a usual solid diet within a few days.  Avoid fast food or heavy meals as your are more likely to get nauseated or have irregular bowels.  A low-fat, high-fiber diet for the rest of your life is ideal.   Take your usually prescribed home medications unless otherwise directed.  PAIN CONTROL: Pain is best controlled by a usual combination of three different methods TOGETHER: Ice/Heat Over the counter pain medication Prescription pain medication Most patients will experience some swelling and bruising around the hernia(s) such as the bellybutton, groins, or old incisions.  Ice packs or heating pads (30-60 minutes up to 6 times a day) will help. Use ice for the first few days to help decrease swelling and bruising, then switch to heat to help relax tight/sore spots and speed recovery.  Some people prefer to use ice alone, heat alone, alternating  between ice & heat.  Experiment to what works for you.  Swelling and bruising can take several weeks to resolve.   It is helpful to take an over-the-counter pain medication regularly for the first few weeks.  Choose one of the following that works best for you: Naproxen (Aleve, etc)  Two 220mg tabs twice a day Ibuprofen (Advil, etc) Three 200mg tabs four times a day (every meal & bedtime) Acetaminophen (Tylenol, etc) 325-650mg four times a day (every meal & bedtime) A  prescription for pain medication should be given to you upon discharge.  Take your pain medication as prescribed.  If you are having problems/concerns with the prescription medicine (does not control pain, nausea, vomiting, rash, itching, etc), please call us [phone] to see if we need to switch you to a different pain medicine that will work better for you and/or control your side effect better. If you need a refill on your pain medication, please contact your pharmacy.  They will contact our office to request authorization. Prescriptions will not be filled after 5 pm or on week-ends.  Avoid getting constipated.  Between the surgery and the pain medications, it is common to experience some constipation.  Increasing fluid intake and taking a fiber supplement (such as Metamucil, Citrucel, FiberCon, MiraLax, etc) 1-2 times a day regularly will usually help prevent this problem from occurring.  A mild laxative (prune juice, Milk of Magnesia, MiraLax, etc) should be taken according to package directions if there are no bowel movements after 48 hours.    Wash / shower every day.  You may shower over the dressings   as they are waterproof.    Remove your waterproof bandages, skin tapes, and other bandages 3 days after surgery. You may replace a dressing/Band-Aid to cover the incision for comfort if you wish. You may leave the incisions open to air.  You may replace a dressing/Band-Aid to cover an incision for comfort if you wish.  Continue  to shower over incision(s) after the dressing is off.  ACTIVITIES as tolerated:   You may resume regular (light) daily activities beginning the next day--such as daily self-care, walking, climbing stairs--gradually increasing activities as tolerated.  Control your pain so that you can walk an hour a day.  If you can walk 30 minutes without difficulty, it is safe to try more intense activity such as jogging, treadmill, bicycling, low-impact aerobics, swimming, etc. Save the most intensive and strenuous activity for last such as sit-ups, heavy lifting, contact sports, etc  Refrain from any heavy lifting or straining until you are off narcotics for pain control.   DO NOT PUSH THROUGH PAIN.  Let pain be your guide: If it hurts to do something, don't do it.  Pain is your body warning you to avoid that activity for another week until the pain goes down. You may drive when you are no longer taking prescription pain medication, you can comfortably wear a seatbelt, and you can safely maneuver your car and apply brakes. You may have sexual intercourse when it is comfortable.   FOLLOW UP in our office Please call [facility] at [phone] to set up an appointment to see your surgeon in the office for a follow-up appointment approximately 2-3 weeks after your surgery. Make sure that you call for this appointment the day you arrive home to insure a convenient appointment time.  9.  If you have disability of FMLA / Family leave forms, please bring the forms to the office for processing.  (do not give to your surgeon).  WHEN TO CALL US [phone]: Poor pain control Reactions / problems with new medications (rash/itching, nausea, etc)  Fever over 101.5 F (38.5 C) Inability to urinate Nausea and/or vomiting Worsening swelling or bruising Continued bleeding from incision. Increased pain, redness, or drainage from the incision   The clinic staff is available to answer your questions during regular business  hours (8:30am-5pm).  Please don't hesitate to call and ask to speak to one of our nurses for clinical concerns.   If you have a medical emergency, go to the nearest emergency room or call 911.  A surgeon from [facility] is always on call at the hospitals in [place]  [facility] [address], [address], [place], [place]  [address] ?  [address] [address], [place], [place]   [address] MAIN: [phone] ? TOLL FREE: [phone] ? FAX: [phone] [email or web address]  

## 2021-01-16 NOTE — Anesthesia Preprocedure Evaluation (Signed)
Anesthesia Evaluation  Patient identified by MRN, date of birth, ID band Patient awake    Reviewed: Allergy & Precautions, NPO status , Patient's Chart, lab work & pertinent test results  Airway Mallampati: I       Dental no notable dental hx.    Pulmonary asthma ,    Pulmonary exam normal        Cardiovascular hypertension, Pt. on medications Normal cardiovascular exam     Neuro/Psych negative neurological ROS  negative psych ROS   GI/Hepatic Neg liver ROS,   Endo/Other  negative endocrine ROS  Renal/GU negative Renal ROS  negative genitourinary   Musculoskeletal negative musculoskeletal ROS (+)   Abdominal Normal abdominal exam  (+)   Peds  Hematology negative hematology ROS (+)   Anesthesia Other Findings   Reproductive/Obstetrics                             Anesthesia Physical Anesthesia Plan  ASA: 2  Anesthesia Plan: General   Post-op Pain Management:    Induction: Intravenous  PONV Risk Score and Plan: 4 or greater and Ondansetron, Dexamethasone and Midazolam  Airway Management Planned: Oral ETT  Additional Equipment: None  Intra-op Plan:   Post-operative Plan: Extubation in OR  Informed Consent: I have reviewed the patients History and Physical, chart, labs and discussed the procedure including the risks, benefits and alternatives for the proposed anesthesia with the patient or authorized representative who has indicated his/her understanding and acceptance.     Dental advisory given  Plan Discussed with: CRNA  Anesthesia Plan Comments:         Anesthesia Quick Evaluation

## 2021-01-16 NOTE — Plan of Care (Signed)
  Problem: Pain Managment: Goal: General experience of comfort will improve Outcome: Progressing   Problem: Safety: Goal: Ability to remain free from injury will improve Outcome: Progressing   

## 2021-01-16 NOTE — Anesthesia Procedure Notes (Signed)
Procedure Name: Intubation Date/Time: 01/16/2021 2:05 PM Performed by: Raenette Rover, CRNA Pre-anesthesia Checklist: Patient identified, Emergency Drugs available, Suction available and Patient being monitored Patient Re-evaluated:Patient Re-evaluated prior to induction Oxygen Delivery Method: Circle system utilized Preoxygenation: Pre-oxygenation with 100% oxygen Induction Type: IV induction Ventilation: Mask ventilation without difficulty Laryngoscope Size: Mac and 3 Grade View: Grade I Tube type: Oral Tube size: 7.0 mm Number of attempts: 1 Airway Equipment and Method: Stylet Placement Confirmation: ETT inserted through vocal cords under direct vision, positive ETCO2 and breath sounds checked- equal and bilateral Secured at: 21 cm Tube secured with: Tape Dental Injury: Teeth and Oropharynx as per pre-operative assessment

## 2021-01-16 NOTE — Transfer of Care (Signed)
Immediate Anesthesia Transfer of Care Note  Patient: Andrea [name]  Procedure(s) Performed: LAPAROSCOPIC SUPRA UMBILICAL AND UMBILICAL HERNIA REPAIR WITH MESH (Abdomen) BILATERAL LAPAROSCOPIC INGUINAL HERNIA REPAIR WITH MESH (Bilateral: Abdomen)  Patient Location: PACU  Anesthesia Type:General  Level of Consciousness: awake, alert , oriented and patient cooperative  Airway & Oxygen Therapy: Patient Spontanous Breathing and Patient connected to face mask oxygen  Post-op Assessment: Report given to RN, Post -op Vital signs reviewed and stable and Patient moving all extremities X 4  Post vital signs: stable  Last Vitals:  Vitals Value Taken Time  BP 148/83 01/16/21 1708  Temp    Pulse 65 01/16/21 1712  Resp 8 01/16/21 1712  SpO2 100 % 01/16/21 1712  Vitals shown include unvalidated device data.  Last Pain:  Vitals:   01/16/21 1149  TempSrc: Oral         Complications: No notable events documented.

## 2021-01-16 NOTE — Anesthesia Postprocedure Evaluation (Signed)
Anesthesia Post Note  Patient: Andrea [name]  Procedure(s) Performed: LAPAROSCOPIC SUPRA UMBILICAL AND UMBILICAL HERNIA REPAIR WITH MESH (Abdomen) BILATERAL LAPAROSCOPIC INGUINAL HERNIA REPAIR WITH MESH (Bilateral: Abdomen)     Patient location during evaluation: PACU Anesthesia Type: General Level of consciousness: awake Pain management: pain level controlled Respiratory status: spontaneous breathing Cardiovascular status: stable Postop Assessment: no apparent nausea or vomiting Anesthetic complications: no   No notable events documented.  Last Vitals:  Vitals:   01/16/21 1753 01/16/21 1801  BP: (!) 149/76   Pulse: 62 (!) 56  Resp: 11 19  Temp: 36.4 C   SpO2: 100% 96%    Last Pain:  Vitals:   01/16/21 1753  TempSrc:   PainSc: 2                  Itzel Lowrimore

## 2021-01-16 NOTE — Op Note (Signed)
01/16/2021  5:06 PM  PATIENT:  Andrea [name]  70 y.o. female  Patient Care Team: [name], MD as PCP - General (Family Medicine) Waymon Budge, MD as Consulting Physician (Pulmonary Disease) Karie Soda, MD as Consulting Physician (General Surgery)  PRE-OPERATIVE DIAGNOSIS:   SUPRAUMBILICAL INCARCERATED ABDOMINAL WALL HERNIA  LEFT POSSIBLE RIGHT INGUINAL HERNIA  POST-OPERATIVE DIAGNOSIS:   SUPRAUMBILICAL INCARCERATED ABDOMINAL WALL HERNIA  BILATERAL INGUINAL HERNIAS  PROCEDURE:   LAPAROSCOPIC SUPRA UMBILICAL AND UMBILICAL HERNIA REPAIRS WITH MESH BILATERAL LAPAROSCOPIC INGUINAL HERNIA REPAIR WITH MESH TRANSVERSUS ABDOMINIS PLANE (TAP) BLOCK - BILATERAL  SURGEON:  Ardeth Sportsman, MD  ASSISTANT: OR Staff  ANESTHESIA:     Regional ilioinguinal and genitofemoral and spermatic cord nerve blocks  General  Nerve block provided with liposomal bupivacaine (Experel) mixed with 0.25% bupivacaine as a Bilateral TAP block x 10mL each side at the level of the transverse abdominis & preperitoneal spaces along the flank at the anterior axillary line, from subcostal ridge to iliac crest under laparoscopic guidance for perioperative & postoperative pain control   EBL:  Total I/O In: 1100 [I.V.:1000; IV Piggyback:100] Out: 75 [Blood:75].  See anesthesia record  Delay start of Pharmacological VTE agent (>24hrs) due to surgical blood loss or risk of bleeding:  no  DRAINS: NONE  SPECIMEN:  NONE  DISPOSITION OF SPECIMEN:  N/A  COUNTS:  YES  PLAN OF CARE: Discharge to home after PACU  PATIENT DISPOSITION:  PACU - hemodynamically stable.  INDICATION: Pleasant obese woman with periumbilical swelling discomfort suspicious for incarcerated hernia.  Probable left inguinal hernia noted as well.  Mild impulse on the right side.  I recommended laparoscopic exploration and repair of hernias found.  The anatomy & physiology of the abdominal wall and pelvic floor was discussed.   The pathophysiology of hernias in the inguinal and pelvic region was discussed.  Natural history risks such as progressive enlargement, pain, incarceration & strangulation was discussed.   Contributors to complications such as smoking, obesity, diabetes, prior surgery, etc were discussed.    I feel the risks of no intervention will lead to serious problems that outweigh the operative risks; therefore, I recommended surgery to reduce and repair the hernia.  I explained laparoscopic techniques with possible need for an open approach.  I noted usual use of mesh to patch and/or buttress hernia repair  Risks such as bleeding, infection, abscess, need for further treatment, heart attack, death, and other risks were discussed.  I noted a good likelihood this will help address the problem.   Goals of post-operative recovery were discussed as well.  Possibility that this will not correct all symptoms was explained.  I stressed the importance of low-impact activity, aggressive pain control, avoiding constipation, & not pushing through pain to minimize risk of post-operative chronic pain or injury. Possibility of reherniation was discussed.  We will work to minimize complications.     An educational handout further explaining the pathology & treatment options was given as well.  Questions were answered.  The patient expresses understanding & wishes to proceed with surgery.  OR FINDINGS: Supraumbilical midline hernia incarcerated with omentum and falciform ligament along with umbilical hernia.  5.5 x 3.5 cm region.  Type of repair: Laparoscopic underlay repair.  Primary repair of largest hernia   Placement of mesh: Retrorectus / Preperitoneal underlay repair  Name of mesh: Bard Ventralight dual sided (polypropylene / Seprafilm)  Size of mesh: 23x17cm  Orientation: Vertical  Mesh overlap:  5-7cm Bilateral indirect inguinal  hernias.  No direct space nor femoral hernias.  Mild laxity at right obturator foramen  but no definite hernia.  No left obturator hernia.  Moderate preperitoneal adhesions from prior Pfannenstiel and right lower quadrant incisions.  DESCRIPTION:  The patient was identified & brought into the operating room. The patient was positioned supine with arms tucked. SCDs were active during the entire case. The patient underwent general anesthesia without any difficulty.  The abdomen was prepped and draped in a sterile fashion. The patient's bladder was emptied.  A Surgical Timeout confirmed our plan.  I made a transverse incision through the inferior umbilical fold.  I made a small transverse nick through the anterior rectus fascia contralateral to the inguinal hernia side and placed a 0-vicryl stitch through the fascia.  I placed a Hasson trocar into the preperitoneal plane.  Entry was clean.  We induced carbon dioxide insufflation. Camera inspection revealed no injury.  I used a 52mm angled scope to bluntly free the peritoneum off the infraumbilical anterior abdominal wall.  I created enough of a preperitoneal pocket to place 12mm ports into the right & left mid-abdomen into this preperitoneal cavity.  I focused attention on the LEFT pelvis since that was the dominant hernia side.   I used blunt & focused sharp dissection to free the peritoneum off the flank and down to the pubic rim.  I freed the anteriolateral bladder wall off the anteriolateral pelvic wall, sparing midline attachments.   I located a swath of peritoneum going into a hernia fascial defect at the  internal ring consistent with  an indirect inguinal hernia..  I gradually freed the peritoneal hernia sac off safely and reduced it into the preperitoneal space.  I freed the peritoneum off the round ligament.  I freed peritoneum off the retroperitoneum along the psoas muscle.  Spermatic cord lipoma was dissected away & removed.  I checked & assured hemostasis.  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  This was seen and closed using 2-0 Vicryl laparoscopic intracorporeal suturing to good result.  I turned attention on the opposite  RIGHT pelvis.  I did dissection in a similar, mirror-image fashion. The patient had an indirect inguinal hernia.Marland Kitchen   Spermatic cord lipoma was dissected away & removed.    I checked & assured hemostasis.     I chose 15x15 cm sheets of ultra-lightweight polypropylene mesh (Ultrapro), one for each side.  I cut a single sigmoid-shaped slit ~6cm from a corner of each mesh.  I placed the meshes into the preperitoneal space & laid them as overlapping diamonds such that at the inferior points, a 6x6 cm corner flap rested in the true anterolateral pelvis, covering the obturator & femoral foramina.   I allowed the bladder to return to the pubis, this helping tuck the corners of the mesh in the anteriolateral pelvis.  The medial corners overlapped each other across midline cephalad to the pubic rim.   Given the numerous hernias of moderate size, I placed a third 15x15cm mesh in the center as a vertical diamond.  The lateral wings of the mesh overlap across the direct spaces and internal rings where the dominant hernias were.  This provided good coverage and reinforcement of the hernia repairs.  Because of the central mesh placement with good overlap, I did not place any tacks.   Next I focused on the periumbilical  hernias.  I redirected the right lateral 5 Miller port into the peritoneal cavity carefully and confirmed no injury.  Placed ports in the right anterior axillary line.  Did inspection and confirmed falciform ligament going up into a supraumbilical hernia as well as some greater omentum.  I freed the peritoneum off the anterior abdominal wall and brought the falciform ligament and hernia sacs down to confirm supraumbilical and umbilical hernias.  I connected with my preperitoneal  dissection infraumbilically.  Mapped out the region and shows 23 x 17 cm mesh.  I placed sutures around its periphery every 5 cm = does not total.  Hydrated and rolled the mesh and placed it through the supraumbilical larger hernia.  Unrolled and position appropriately.  I used a laparoscopic suture passer to pull up superior inferior and lateral sutures to center the mesh and confirmed good positioning.  The inferior edge overlapped with the superior diamond preperitoneal ultra Pro mesh as well.  I then used Endo Close to pull up all 12 sutures for good fascial approximation.  This provided good overlap.  I evacuated carbon dioxide and tied the sutures down.  I primarily closed the larger supraumbilical hernia transverse using #1 PDS interrupted suture to good result.  Close the infraumbilical fascial defect site with 0 Vicryl and #1 PDS as well.  I reinsufflated the abdomen.  I brought a #1 PDS suture through the periumbilical fascial closure, mesh, falciform ligament and peritoneum.  Then brought it up through the abdominal wall again to help centrally tacked the peritoneum and falciform ligament.  This provided good central opposition as well.  And then used a tacker to tack the peritoneum around circumferentially to good result.  Lysis of adhesions to have greater omentum followed down to the lower abdomen and better cover over the mesh and peritoneum.  I evacuated carbon dioxide.  I closed the fascia with absorbable suture.  I closed the skin using 4-0 monocryl stitch.  Sterile dressings were applied.   The patient was extubated & arrived in the PACU in stable condition..  I had discussed postoperative care with the patient in the holding area.  Instructions are written in the chart.  I discussed operative findings, updated the patient's status, discussed probable steps to recovery, and gave postoperative recommendations to the patient's daughter, Lanora Manis.  Recommendations were made.  Questions were  answered.  She expressed understanding & appreciation.   Ardeth Sportsman, M.D., F.A.C.S. Gastrointestinal and Minimally Invasive Surgery [facility] [address] [place], [place] [address] [phone] Main / Paging  01/16/2021 5:06 PM

## 2021-01-16 NOTE — Interval H&P Note (Signed)
History and Physical Interval Note:  01/16/2021 12:49 PM  Andrea [name]  has presented today for surgery, with the diagnosis of SUPRAUMBILICAL INCARCERATED ABDOMINAL WALL HERNIA POSSIBLE LEFT INGUINAL HERNIA.  The various methods of treatment have been discussed with the patient and family. After consideration of risks, benefits and other options for treatment, the patient has consented to  Procedure(s): LAPAROSCOPIC VENTRAL WALL HERNIA REPAIR WITH MESH (N/A) POSSIBLE BILATERAL LAPAROSCOPIC INGUINAL HERNIA REPAIR WITH MESH (Bilateral) as a surgical intervention.  The patient's history has been reviewed, patient examined, no change in status, stable for surgery.  I have reviewed the patient's chart and labs.  Questions were answered to the patient's satisfaction.    I have re-reviewed the the patient's records, history, medications, and allergies.  I have re-examined the patient.  I again discussed intraoperative plans and goals of post-operative recovery.  The patient agrees to proceed.  Andrea [name]  1951/05/02 [id number]  Patient Care Team: [name], MD as PCP - General (Family Medicine) Waymon Budge, MD as Consulting Physician (Pulmonary Disease) Karie Soda, MD as Consulting Physician (General Surgery)  Patient Active Problem List   Diagnosis Date Noted   Asthma, moderate persistent 10/04/2007    Priority: Medium   GERD (gastroesophageal reflux disease) 12/12/2020   Supraumbilical hernia 10/22/2020   Obesity (BMI 30-39.9) 10/22/2020   Constipation, chronic 10/22/2020   Peripheral edema 09/16/2019   Acute upper respiratory infection 06/23/2013   URTICARIA 01/05/2009   Pulmonary embolism (HCC) 10/05/2007   Seasonal and perennial allergic rhinitis 10/04/2007   Osteoporosis 10/04/2007    Past Medical History:  Diagnosis Date   Allergic rhinitis, cause unspecified    Asthma    Dyspnea    exertion   Hypertension    Osteoporosis    Other pulmonary embolism  and infarction    from oral contraceptives   Pneumonia    Post-phlebitic syndrome    Unspecified asthma(493.90)    Urticaria 2010    Past Surgical History:  Procedure Laterality Date   APPENDECTOMY     BLADDER REPAIR     SHOULDER ARTHROSCOPY Left    TONSILLECTOMY     VESICOVAGINAL FISTULA CLOSURE W/ TAH      Social History   Socioeconomic History   Marital status: Divorced    Spouse name: Not on file   Number of children: 1   Years of education: Not on file   Highest education level: Not on file  Occupational History   Occupation: customer service    Employer: [employer]vertising [employer]  Tobacco Use   Smoking status: Never   Smokeless tobacco: Never  Vaping Use   Vaping Use: Never used  Substance and Sexual Activity   Alcohol use: Yes    Comment: occas.   Drug use: Never   Sexual activity: Not on file  Other Topics Concern   Not on file  Social History Narrative   Not on file   Social Determinants of Health   Financial Resource Strain: Not on file  Food Insecurity: Not on file  Transportation Needs: Not on file  Physical Activity: Not on file  Stress: Not on file  Social Connections: Not on file  Intimate Partner Violence: Not on file    Family History  Problem Relation Age of Onset   Lupus Mother    Rheum arthritis Mother    Heart disease Mother    Von Willebrand disease Sister    Stroke Other    Heart attack Other  Asthma Sister     Medications Prior to Admission  Medication Sig Dispense Refill Last Dose   acetaminophen (TYLENOL) 500 MG tablet Take 500-1,000 mg by mouth every 6 (six) hours as needed (pain.).   01/15/2021   albuterol (ACCUNEB) 0.63 MG/3ML nebulizer solution USE 1 VIAL IN NEBULIZER EVERY 6 HOURS AS NEEDED (Patient taking differently: Take 1 ampule by nebulization every 6 (six) hours as needed for wheezing or shortness of breath (asthma flares).) 75 mL 11 Past Month   albuterol (VENTOLIN HFA) 108 (90 Base) MCG/ACT inhaler INHALE 2 PUFFS  EVERY 6 HOURS AS NEEDED WHEEZING OR FOR SHORTNESS OF BREATH (Patient taking differently: Inhale 2 puffs into the lungs every 6 (six) hours as needed for wheezing or shortness of breath.) 18 g 3 Past Week   Calcium Carbonate-Vitamin D3 600-400 MG-UNIT TABS Take 1 tablet by mouth in the morning.   01/15/2021   Cholecalciferol (VITAMIN D-3) 125 MCG (5000 UT) TABS Take 5,000 Units by mouth in the morning.   01/15/2021   fluticasone (FLONASE) 50 MCG/ACT nasal spray SPRAY TWO SPRAYS IN EACH NOSTRIL ONCE DAILY AS NEEDED FOR RHINITIS. SHAKE WELL BEFORE EACH USE (Patient taking differently: Place 1-2 sprays into both nostrils daily as needed (allergies/rhinitis). SPRAY TWO SPRAYS IN EACH NOSTRIL ONCE DAILY AS NEEDED FOR RHINITIS. SHAKE WELL BEFORE EACH USE) 16 mL 6 01/15/2021   Fluticasone-Salmeterol (ADVAIR DISKUS) 500-50 MCG/DOSE AEPB INHALE ONE PUFF BY MOUTH TWICE A DAY (Patient taking differently: Inhale 1 puff into the lungs in the morning and at bedtime. INHALE ONE PUFF BY MOUTH TWICE A DAY) 60 each 11 01/16/2021   furosemide (LASIX) 20 MG tablet Take 20 mg by mouth daily as needed (fluid retention (with respiratory side effects)).   Past Week   hydrochlorothiazide (MICROZIDE) 12.5 MG capsule Take 12.5 mg by mouth in the morning.   01/15/2021   ibandronate (BONIVA) 150 MG tablet Take 150 mg by mouth every 30 (thirty) days.      LIPITOR 40 MG tablet Take 40 mg by mouth at bedtime.   01/15/2021   montelukast (SINGULAIR) 10 MG tablet TAKE ONE TABLET BY MOUTH DAILY (Patient taking differently: Take 10 mg by mouth at bedtime.) 30 tablet 6 01/15/2021   predniSONE (DELTASONE) 20 MG tablet Take 1 tablet by mouth daily as directed (Patient taking differently: Take 10-60 mg by mouth as directed. Take as directed for asthma flare ups; take 3 tablets (60 mg) by mouth for 2 days, take 2 tablets (40 mg) by mouth for 2 days, take 1 tablet (20 mg) by mouth for 2 days & take 0.5 tablet (10 mg) by mouth for 2 days then discontinue.)  30 tablet 5 01/15/2021   Budeson-Glycopyrrol-Formoterol (BREZTRI AEROSPHERE) 160-9-4.8 MCG/ACT AERO Inhale 2 puffs into the lungs 2 (two) times daily. (Patient not taking: Reported on 01/02/2021) 10.7 g 0 Not Taking at Unknown time   Peak Flow Meter DEVI Use as directed 1 each 0     Current Facility-Administered Medications  Medication Dose Route Frequency Provider Last Rate Last Admin   bupivacaine liposome (EXPAREL) 1.3 % injection 266 mg  20 mL Infiltration Once Karie Soda, MD       ceFAZolin (ANCEF) IVPB 2g/100 mL premix  2 g Intravenous On Call to OR Karie Soda, MD       Chlorhexidine Gluconate Cloth 2 % PADS 6 each  6 each Topical Once Karie Soda, MD       feeding supplement (ENSURE PRE-SURGERY) liquid 296 mL  296 mL Oral Once Karie Soda, MD       lactated ringers infusion   Intravenous Continuous Leilani Able, MD 10 mL/hr at 01/16/21 1245 Continued from Pre-op at 01/16/21 1245     Allergies  Allergen Reactions   Rofecoxib Shortness Of Breath and Other (See Comments)    (Vioxx) wheezing   Moxifloxacin     wheezy   Pneumovax 23 [Pneumococcal Vac Polyvalent] Hives and Itching    BP 118/72   Pulse 76   Temp 98.4 F (36.9 C) (Oral)   Resp 18   SpO2 98%   Labs: No results found for this or any previous visit (from the past 48 hour(s)).  Imaging / Studies: No results found.   Ardeth Sportsman, M.D., F.A.C.S. Gastrointestinal and Minimally Invasive Surgery [facility] [address] [place], [place] [address] [phone] Main / Paging  01/16/2021 12:49 PM    Ardeth Sportsman

## 2021-01-17 ENCOUNTER — Encounter (HOSPITAL_COMMUNITY): Payer: Self-pay | Admitting: Surgery

## 2021-01-17 ENCOUNTER — Other Ambulatory Visit: Payer: Self-pay | Admitting: Internal Medicine

## 2021-01-17 DIAGNOSIS — K420 Umbilical hernia with obstruction, without gangrene: Secondary | ICD-10-CM | POA: Diagnosis not present

## 2021-01-17 NOTE — Progress Notes (Signed)
Ambulated in the hall x 3 times

## 2021-01-17 NOTE — Plan of Care (Signed)
Instructions were reviewed with patient. All questions were answered. Patient was transported to main entrance by wheelchair. ° °

## 2021-01-17 NOTE — Discharge Summary (Signed)
Physician Discharge Summary    Patient ID: Andrea [name] MRN: [id number] DOB/AGE: [birth year]  70 y.o.  Patient Care Team: [name], MD as PCP - General (Family Medicine) Waymon Budge, MD as Consulting Physician (Pulmonary Disease) Karie Soda, MD as Consulting Physician (General Surgery)  Admit date: 01/16/2021  Discharge date: 01/17/2021  Hospital Stay = 0 days    Discharge Diagnoses:  Principal Problem:   Incarcerated ventral hernia s/p lap repair w mesh 01/16/2021 Active Problems:   Asthma, moderate persistent   Obesity (BMI 30-39.9)   Bilateral inguinal hernia (BIH) s/p lap repair w mesh 01/16/2021   Surgery, elective   1 Day Post-Op  01/16/2021  POST-OPERATIVE DIAGNOSIS:   SUPRAUMBILICAL INCARCERATED ABDOMINAL WALL HERNIA POSSIBLE LEFT INGUINAL HERNIA  SURGERY:  01/16/2021  Procedure(s): LAPAROSCOPIC SUPRA UMBILICAL AND UMBILICAL HERNIA REPAIR WITH MESH BILATERAL LAPAROSCOPIC INGUINAL HERNIA REPAIR WITH MESH  SURGEON:    Surgeon(s): Karie Soda, MD  Consults: None  Hospital Course:   The patient underwent the surgery above.  Postoperatively, the patient gradually mobilized and advanced to a solid diet.  Pain and other symptoms were treated aggressively.    By the time of discharge, the patient was walking well the hallways, eating food, having flatus.  Pain was well-controlled on an oral medications.  Based on meeting discharge criteria and continuing to recover, I felt it was safe for the patient to be discharged from the hospital to further recover with close followup. Postoperative recommendations were discussed in detail.  They are written as well.  Discharged Condition: good  Discharge Exam: Blood pressure 114/61, pulse 73, temperature 98.3 F (36.8 C), temperature source Oral, resp. rate 16, height 5\' 3"  (1.6 m), weight 90 kg, SpO2 95 %.  General: Pt awake/alert/oriented x4 in No acute distress Eyes: PERRL, normal EOM.  Sclera  clear.  No icterus Neuro: CN II-XII intact w/o focal sensory/motor deficits. Lymph: No head/neck/groin lymphadenopathy Psych:  No delerium/psychosis/paranoia HENT: Normocephalic, Mucus membranes moist.  No thrush Neck: Supple, No tracheal deviation Chest:  No chest wall pain w good excursion CV:  Pulses intact.  Regular rhythm MS: Normal AROM mjr joints.  No obvious deformity Abdomen: Soft.  Nondistended.  Mildly tender at incisions only.  Dressings c/d/i No evidence of peritonitis.  No incarcerated hernias. Ext:  SCDs BLE.  No mjr edema.  No cyanosis Skin: No petechiae / purpura   Disposition:    Follow-up Information     , MD Follow up in 3 week(s).   Specialties: General Surgery, Colon and Rectal Surgery Why: To follow up after your operation Contact information: [address] [place] [phone]                 Discharge disposition: 01-Home or Self Care       Discharge Instructions     Call MD for:   Complete by: As directed    FEVER >101.5 F (Temperatures <101.74F occasionally happen and are not significant)   Call MD for:  extreme fatigue   Complete by: As directed    Call MD for:  persistant dizziness or light-headedness   Complete by: As directed    Call MD for:  persistant nausea and vomiting   Complete by: As directed    Call MD for:  redness, tenderness, or signs of infection (pain, swelling, redness, odor or green/yellow discharge around incision site)   Complete by: As directed    Call MD for:  severe uncontrolled  pain   Complete by: As directed    Diet - low sodium heart healthy   Complete by: As directed    Follow a light diet the first few days at home.  Start with a bland diet such as soups, liquids, starchy foods, low fat foods, etc.   If you feel full, bloated, or constipated, stay on a full liquid or pureed/blenderized diet for a few days until you feel better and no longer  constipated. Gradually get back to a regular solid diet.  Avoid fast food or heavy meals the first week as you are more likely to get nauseated.   Discharge instructions   Complete by: As directed    One the day of your discharge from the hospital (or the next business weekday), please call [facility] to set up or confirm an appointment to see your surgeon in the office for a follow-up appointment.  Usually it is 2-3 weeks after your surgery.  Other concerns If you are not getting better after two weeks or are noticing you are getting worse, contact our office [phone] for further advice.  We may need to adjust your medications, re-evaluate you in the office, send you to the emergency room, or see what other things we can do to help. The clinic staff is available to answer your questions during regular business hours (8:30am-5pm).  Please don't hesitate to call and ask to speak to one of our nurses for clinical concerns.    A surgeon from [facility] is always on call at the hospitals 24 hours/day If you have a medical emergency, go to the nearest emergency room or call 911.   Discharge wound care:   Complete by: As directed    STERISTRIPS:  You have skin tapes called Steristrips on your incisions.  Leave them in place, and they will fall off on their own like a scab in 1-3 weeks.  You may trim any edges that curl up with clean scissors.  You may remove them in the shower in a week.  DERMABOND:  You have purple skin glue (Dermabond) on your incision(s).  Leave them in place, and they will fall off on their own like a scab in 2-3 weeks.  You may trim any edges that curl up with clean scissors.   Driving Restrictions   Complete by: As directed    You may drive when you are no longer taking prescription pain medication, you can comfortably wear a seatbelt, and you can safely maneuver your car and apply brakes.   Increase activity slowly   Complete by: As directed     Lifting restrictions   Complete by: As directed    You may resume regular (light) daily activities beginning the next day-such as daily self-care, walking, climbing stairs-gradually increasing activities as tolerated.   If you can walk 30 minutes without difficulty, it is safe to try more intense activity such as jogging, treadmill, bicycling, low-impact aerobics, swimming, etc. Save the most intensive and strenuous activity for last such as sit-ups, heavy lifting, contact sports, etc   Refrain from any heavy lifting or straining until you are off narcotics for pain control.   DO NOT PUSH THROUGH PAIN.   Let pain be your guide: If it hurts to do something, don't do it.   Pain is your body warning you to avoid that activity for another week until the pain goes down.   May shower / Bathe   Complete by: As  directed    Wash / shower every day.  You may shower over the dressings as they are waterproof.  Continue to shower over incision(s) after the dressing is off.   May walk up steps   Complete by: As directed    Sexual Activity Restrictions   Complete by: As directed    You may have sexual intercourse when it is comfortable. If it hurts to do something, stop.       Allergies as of 01/17/2021       Reactions   Rofecoxib Shortness Of Breath, Other (See Comments)   (Vioxx) wheezing   Moxifloxacin    wheezy   Pneumovax 23 [pneumococcal Vac Polyvalent] Hives, Itching        Medication List     STOP taking these medications    Breztri Aerosphere 160-9-4.8 MCG/ACT Aero Generic drug: Budeson-Glycopyrrol-Formoterol       TAKE these medications    acetaminophen 500 MG tablet Commonly known as: TYLENOL Take 500-1,000 mg by mouth every 6 (six) hours as needed (pain.).   Calcium Carbonate-Vitamin D3 600-400 MG-UNIT Tabs Take 1 tablet by mouth in the morning.   furosemide 20 MG tablet Commonly known as: LASIX Take 20 mg by mouth daily as needed (fluid retention (with respiratory  side effects)).   hydrochlorothiazide 12.5 MG capsule Commonly known as: MICROZIDE Take 12.5 mg by mouth in the morning.   ibandronate 150 MG tablet Commonly known as: BONIVA Take 150 mg by mouth every 30 (thirty) days.   Lipitor 40 MG tablet Generic drug: atorvastatin Take 40 mg by mouth at bedtime.   methocarbamol 750 MG tablet Commonly known as: ROBAXIN Take 1 tablet (750 mg total) by mouth 4 (four) times daily as needed (use for muscle cramps/pain).   oxyCODONE 5 MG immediate release tablet Commonly known as: Oxy IR/ROXICODONE Take 1-2 tablets (5-10 mg total) by mouth every 6 (six) hours as needed for moderate pain, severe pain or breakthrough pain.   Peak Flow Meter Devi Use as directed   Vitamin D-3 125 MCG (5000 UT) Tabs Take 5,000 Units by mouth in the morning.       ASK your doctor about these medications    albuterol 0.63 MG/3ML nebulizer solution Commonly known as: ACCUNEB USE 1 VIAL IN NEBULIZER EVERY 6 HOURS AS NEEDED   albuterol 108 (90 Base) MCG/ACT inhaler Commonly known as: Ventolin HFA INHALE 2 PUFFS EVERY 6 HOURS AS NEEDED WHEEZING OR FOR SHORTNESS OF BREATH   fluticasone 50 MCG/ACT nasal spray Commonly known as: FLONASE SPRAY TWO SPRAYS IN EACH NOSTRIL ONCE DAILY AS NEEDED FOR RHINITIS. SHAKE WELL BEFORE EACH USE   Fluticasone-Salmeterol 500-50 MCG/DOSE Aepb Commonly known as: Advair Diskus INHALE ONE PUFF BY MOUTH TWICE A DAY   montelukast 10 MG tablet Commonly known as: SINGULAIR TAKE ONE TABLET BY MOUTH DAILY   predniSONE 20 MG tablet Commonly known as: Deltasone Take 1 tablet by mouth daily as directed               Discharge Care Instructions  (From admission, onward)           Start     Ordered   01/16/21 0000  Discharge wound care:       Comments: STERISTRIPS:  You have skin tapes called Steristrips on your incisions.  Leave them in place, and they will fall off on their own like a scab in 1-3 weeks.  You may trim  any edges that curl up with clean scissors.  You may remove them in the shower in a week.  DERMABOND:  You have purple skin glue (Dermabond) on your incision(s).  Leave them in place, and they will fall off on their own like a scab in 2-3 weeks.  You may trim any edges that curl up with clean scissors.   01/16/21 1705            Significant Diagnostic Studies:  Results for orders placed or performed during the hospital encounter of 01/14/21 (from the past 72 hour(s))  SARS CORONAVIRUS 2 (TAT 6-24 HRS) Nasopharyngeal Nasopharyngeal Swab     Status: None   Collection Time: 01/14/21  8:46 AM   Specimen: Nasopharyngeal Swab  Result Value Ref Range   SARS Coronavirus 2 NEGATIVE NEGATIVE    Comment: (NOTE) SARS-CoV-2 target nucleic acids are NOT DETECTED.  The SARS-CoV-2 RNA is generally detectable in upper and lower respiratory specimens during the acute phase of infection. Negative results do not preclude SARS-CoV-2 infection, do not rule out co-infections with other pathogens, and should not be used as the sole basis for treatment or other patient management decisions. Negative results must be combined with clinical observations, patient history, and epidemiological information. The expected result is Negative.  Fact Sheet for Patients: HairSlick.nohttps://www.fda.gov/media/138098/download  Fact Sheet for Healthcare Providers: quierodirigir.comhttps://www.fda.gov/media/138095/download  This test is not yet approved or cleared by the [place] FDA and  has been authorized for detection and/or diagnosis of SARS-CoV-2 by FDA under an Emergency Use Authorization (EUA). This EUA will remain  in effect (meaning this test can be used) for the duration of the COVID-19 declaration under Se ction 564(b)(1) of the Act, 21 U.S.C. section 360bbb-3(b)(1), unless the authorization is terminated or revoked sooner.  Performed at [facility], [address] [place], [place] [address]     No results  found.  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   Pneumonia    Post-phlebitic syndrome    Unspecified asthma(493.90)    Urticaria 2010    Past Surgical History:  Procedure Laterality Date   APPENDECTOMY     BLADDER REPAIR     SHOULDER ARTHROSCOPY Left    TONSILLECTOMY     VESICOVAGINAL FISTULA CLOSURE W/ TAH      Social History   Socioeconomic History   Marital status: Divorced    Spouse name: Not on file   Number of children: 1   Years of education: Not on file   Highest education level: Not on file  Occupational History   Occupation: customer service    Employer: [employer]  Tobacco Use   Smoking status: Never   Smokeless tobacco: Never  Vaping Use   Vaping Use: Never used  Substance and Sexual Activity   Alcohol use: Yes    Comment: occas.   Drug use: Never   Sexual activity: Not on file  Other Topics Concern   Not on file  Social History Narrative   Not on file   Social Determinants of Health   Financial Resource Strain: Not on file  Food Insecurity: Not on file  Transportation Needs: Not on file  Physical Activity: Not on file  Stress: Not on file  Social Connections: Not on file  Intimate Partner  Violence: Not on file    Family History  Problem Relation Age of Onset   Lupus Mother    Rheum arthritis Mother    Heart disease Mother    Von Willebrand disease Sister    Stroke Other    Heart attack Other    Asthma Sister     Current Facility-Administered Medications  Medication Dose Route Frequency Provider Last Rate Last Admin   0.9 %  sodium chloride infusion   Intravenous Q8H PRN Karie Soda, MD       acetaminophen (TYLENOL) tablet 650 mg  650 mg Oral Q4H PRN Karie Soda, MD       Or   acetaminophen  (TYLENOL) suppository 650 mg  650 mg Rectal Q4H PRN Karie Soda, MD       acetaminophen (TYLENOL) tablet 1,000 mg  1,000 mg Oral Q6H Karie Soda, MD   1,000 mg at 01/17/21 0654   albuterol (PROVENTIL) (2.5 MG/3ML) 0.083% nebulizer solution 2.5 mg  2.5 mg Nebulization Q6H PRN Karie Soda, MD       alum & mag hydroxide-simeth (MAALOX/MYLANTA) 200-200-20 MG/5ML suspension 30 mL  30 mL Oral Q6H PRN Karie Soda, MD       atorvastatin (LIPITOR) tablet 40 mg  40 mg Oral Laurena Slimmer, MD   40 mg at 01/16/21 2147   bisacodyl (DULCOLAX) suppository 10 mg  10 mg Rectal Daily PRN Karie Soda, MD       calcium-vitamin D (OSCAL WITH D) 500-200 MG-UNIT per tablet 1 tablet  1 tablet Oral q AM Karie Soda, MD   1 tablet at 01/17/21 0655   [COMPLETED] ceFAZolin (ANCEF) IVPB 2g/100 mL premix  2 g Intravenous Trixie Deis, MD 200 mL/hr at 01/16/21 2150 2 g at 01/16/21 2150   cholecalciferol (VITAMIN D) tablet 5,000 Units  5,000 Units Oral q AM Karie Soda, MD   5,000 Units at 01/17/21 0656   diphenhydrAMINE (BENADRYL) injection 12.5-25 mg  12.5-25 mg Intravenous Q6H PRN Karie Soda, MD       enoxaparin (LOVENOX) injection 40 mg  40 mg Subcutaneous Q24H Karie Soda, MD       fentaNYL (SUBLIMAZE) injection 25-50 mcg  25-50 mcg Intravenous Q2H PRN Karie Soda, MD       fluticasone (FLONASE) 50 MCG/ACT nasal spray 2 spray  2 spray Each Nare Daily Karie Soda, MD       hydrochlorothiazide (MICROZIDE) capsule 12.5 mg  12.5 mg Oral q AM Karie Soda, MD   12.5 mg at 01/17/21 1610   lactated ringers infusion   Intravenous Continuous Karie Soda, MD 50 mL/hr at 01/16/21 1847 New Bag at 01/16/21 1847   lip balm (CARMEX) ointment 1 application  1 application Topical BID Karie Soda, MD   1 application at 01/16/21 1848   magic mouthwash  15 mL Oral QID PRN Karie Soda, MD       magnesium hydroxide (MILK OF MAGNESIA) suspension 30 mL  30 mL Oral Daily PRN Karie Soda, MD        menthol-cetylpyridinium (CEPACOL) lozenge 3 mg  1 lozenge Oral PRN Karie Soda, MD       methocarbamol (ROBAXIN) 1,000 mg in dextrose 5 % 100 mL IVPB  1,000 mg Intravenous Q6H PRN Karie Soda, MD       methocarbamol (ROBAXIN) tablet 1,000 mg  1,000 mg Oral Q6H PRN Karie Soda, MD       metoprolol tartrate (LOPRESSOR) injection 5 mg  5 mg Intravenous Q6H PRN Karie Soda,  MD       mometasone-formoterol (DULERA) 200-5 MCG/ACT inhaler 2 puff  2 puff Inhalation BID Karie Soda, MD   2 puff at 01/16/21 1927   montelukast (SINGULAIR) tablet 10 mg  10 mg Oral Laurena Slimmer, MD   10 mg at 01/16/21 2150   oxyCODONE (Oxy IR/ROXICODONE) immediate release tablet 5-10 mg  5-10 mg Oral Q4H PRN Karie Soda, MD   10 mg at 01/16/21 2039   phenol (CHLORASEPTIC) mouth spray 2 spray  2 spray Mouth/Throat PRN Karie Soda, MD       polyethylene glycol (MIRALAX / GLYCOLAX) packet 17 g  17 g Oral Q12H PRN Karie Soda, MD       prochlorperazine (COMPAZINE) injection 5-10 mg  5-10 mg Intravenous Q4H PRN Karie Soda, MD       simethicone (MYLICON) 40 MG/0.6ML suspension 80 mg  80 mg Oral QID PRN Karie Soda, MD         Allergies  Allergen Reactions   Rofecoxib Shortness Of Breath and Other (See Comments)    (Vioxx) wheezing   Moxifloxacin     wheezy   Pneumovax 23 [Pneumococcal Vac Polyvalent] Hives and Itching    Signed: Lorenso Courier, MD, FACS, MASCRS Esophageal, Gastrointestinal & Colorectal Surgery Robotic and Minimally Invasive Surgery  [facility] [address] [place], [place] [address] [phone] Fax [phone] Main/Paging  CONTACT INFORMATION: Weekday (9AM-5PM) concerns: Call [facility] main office at [phone] Weeknight (5PM-9AM) or Weekend/Holiday concerns: Check [email or web address] for General Surgery [facility] coverage (Please, do not use SecureChat as it is not reliable communication to operating surgeons for immediate patient  care)      01/17/2021, 7:13 AM

## 2021-01-21 DIAGNOSIS — Z0289 Encounter for other administrative examinations: Secondary | ICD-10-CM

## 2021-01-24 NOTE — Telephone Encounter (Signed)
Charge dropped, fee paid. Emailed Lauren forms to have them fax to [employer] @ [phone]

## 2021-04-15 IMAGING — CT CT ANGIO CHEST
2 of 8 series · 19 of 46 positions shown · IV contrast (omnipaque)
Comparison: Chest x-ray dated 09/08/2019

CLINICAL DATA: Shortness of breath with exertion. Bilateral lower
leg edema. Positive D-dimer.

EXAM:
CT ANGIOGRAPHY CHEST WITH CONTRAST
TECHNIQUE: Multidetector CT imaging of the chest was performed using the
standard protocol during bolus administration of intravenous
contrast. Multiplanar CT image reconstructions and MIPs were
obtained to evaluate the vascular anatomy.
CONTRAST:  80mL OMNIPAQUE IOHEXOL 350 MG/ML SOLN

[Series 5: thins · axial · 0.63mm/px · z∈[-304,-68]mm · 16 of 266 slices shown]
[im 15/266  lung]
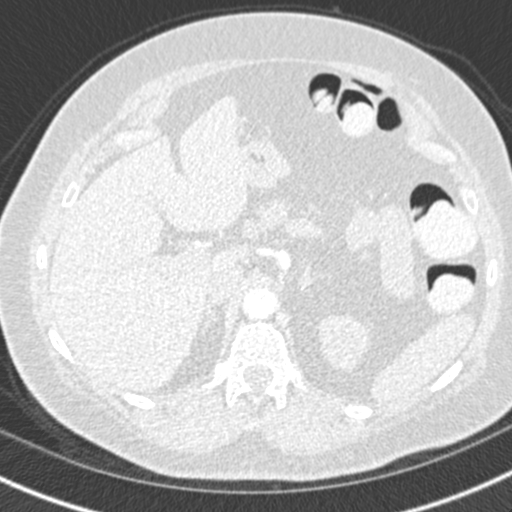
[im 30/266  soft-tissue]
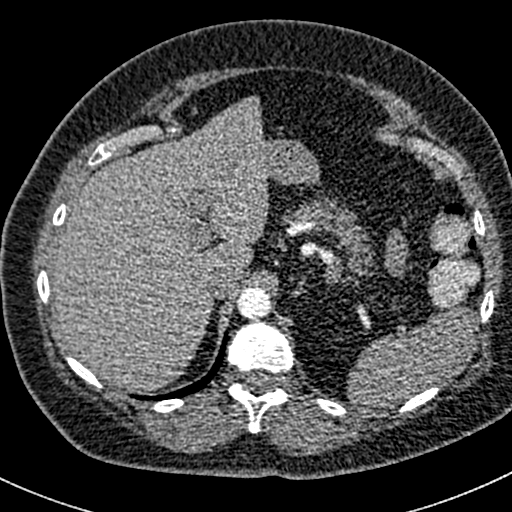
[im 45/266  lung]
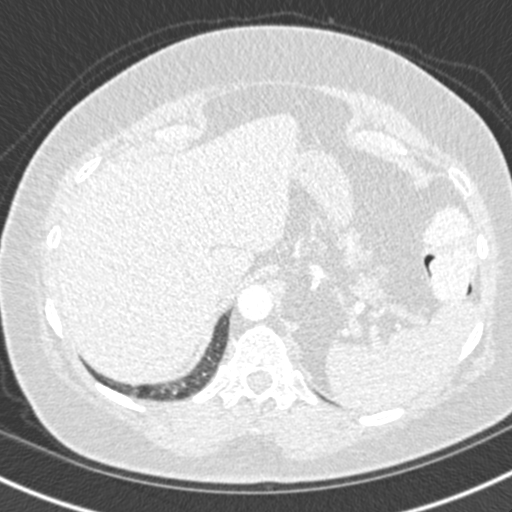
[im 59/266  soft-tissue]
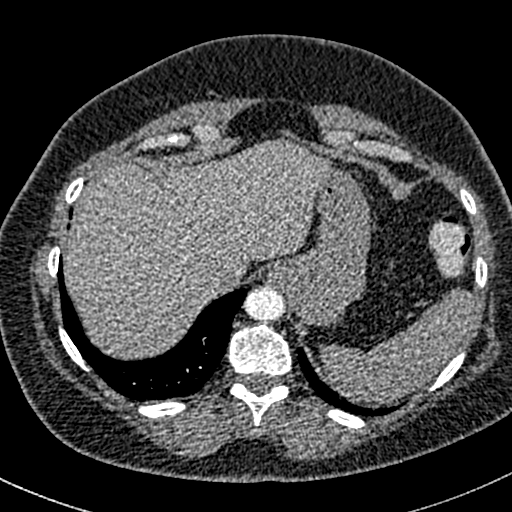
[im 74/266  lung]
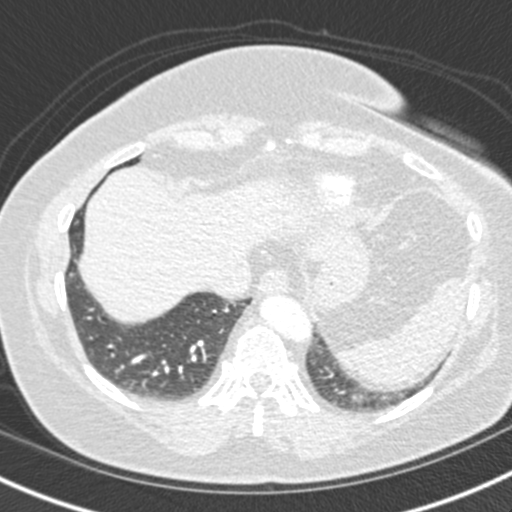
[im 89/266  soft-tissue]
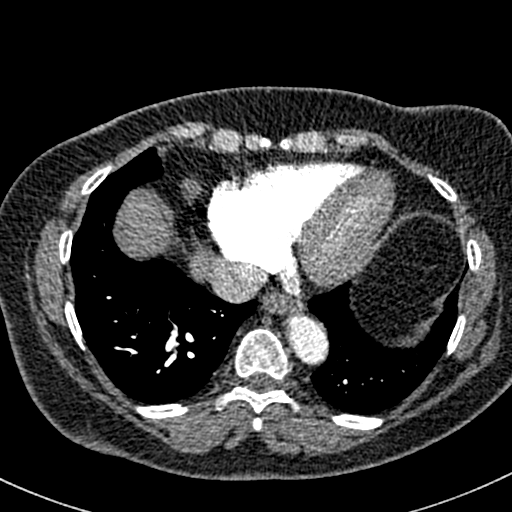
[im 104/266  lung]
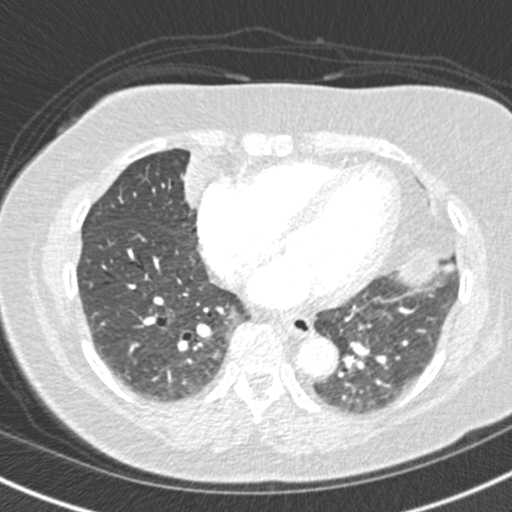
[im 118/266  soft-tissue]
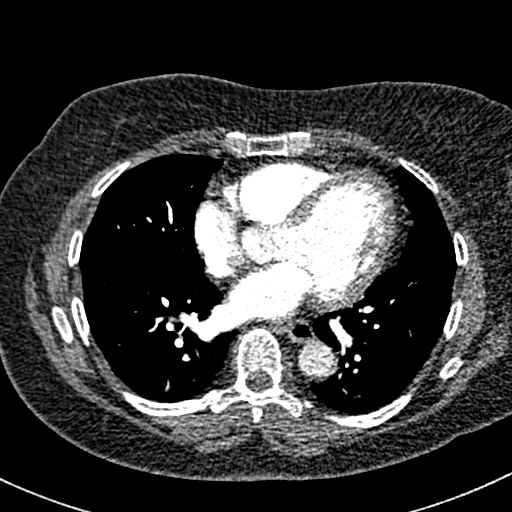
[im 148/266  lung]
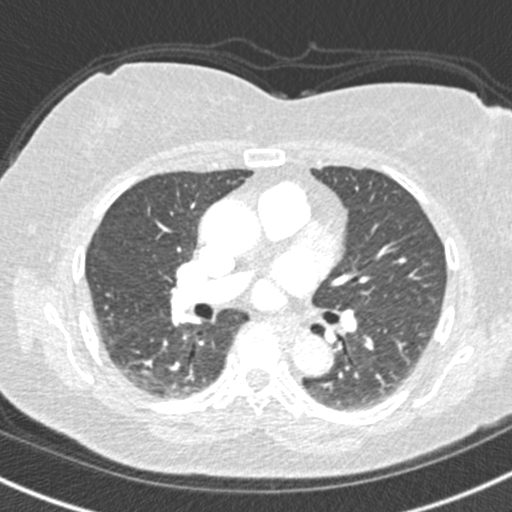
[im 162/266  soft-tissue]
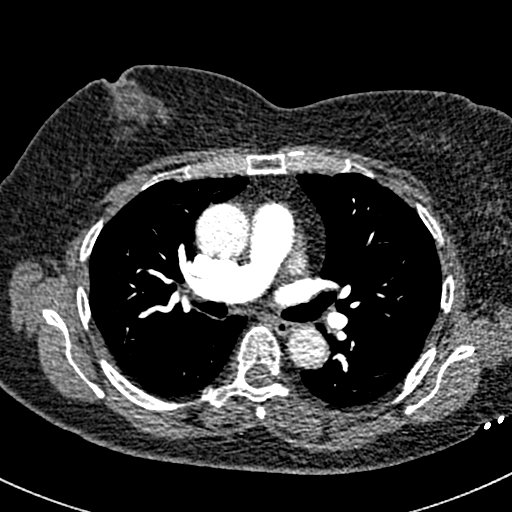
[im 177/266  lung]
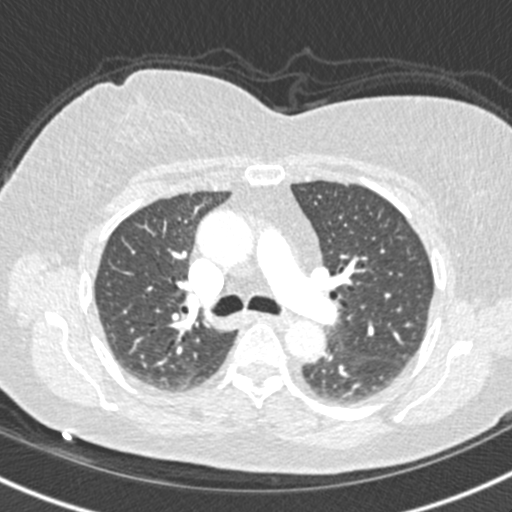
[im 192/266  soft-tissue]
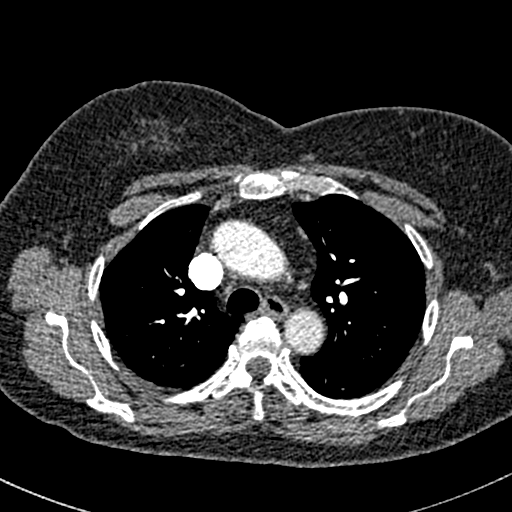
[im 207/266  lung]
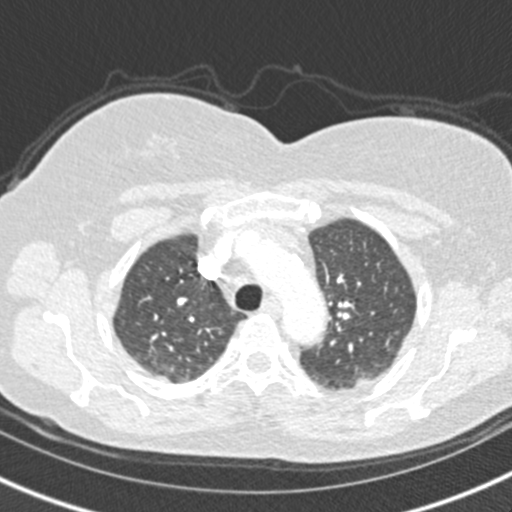
[im 221/266  soft-tissue]
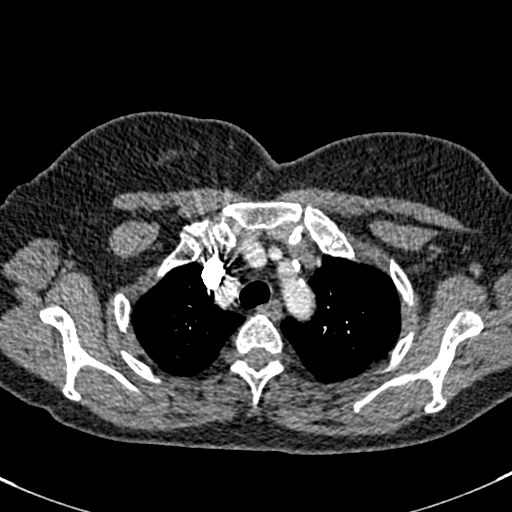
[im 236/266  lung]
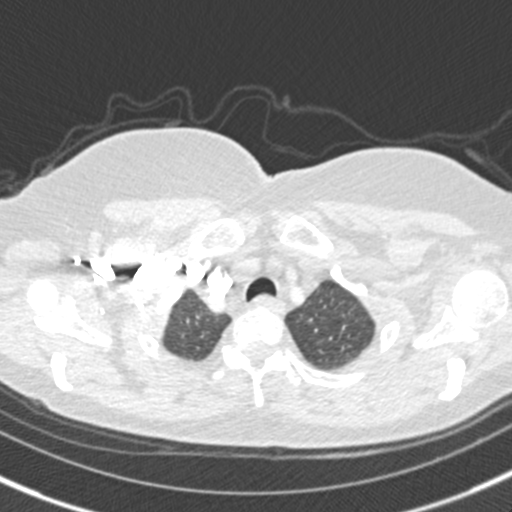
[im 251/266  soft-tissue]
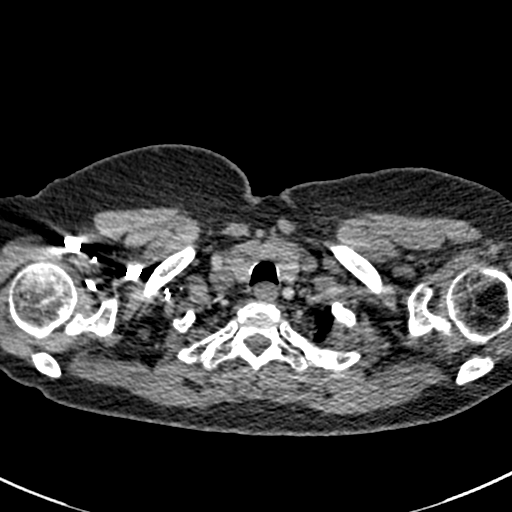

[Series 7: coronal mpr · coronal · 0.52mm/px · 3 of 103 slices shown]
[im 26/103  soft-tissue]
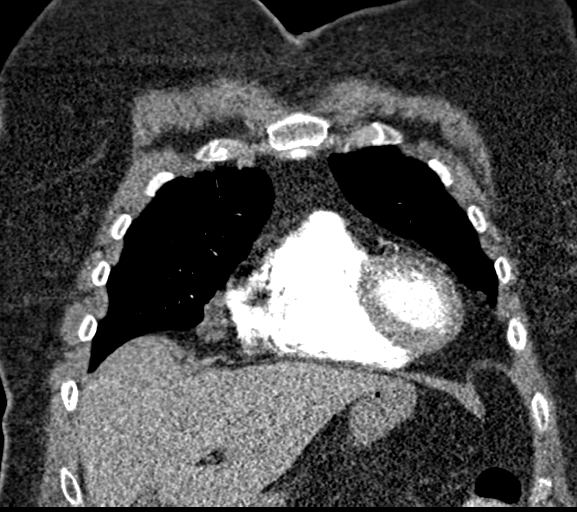
[im 52/103  soft-tissue]
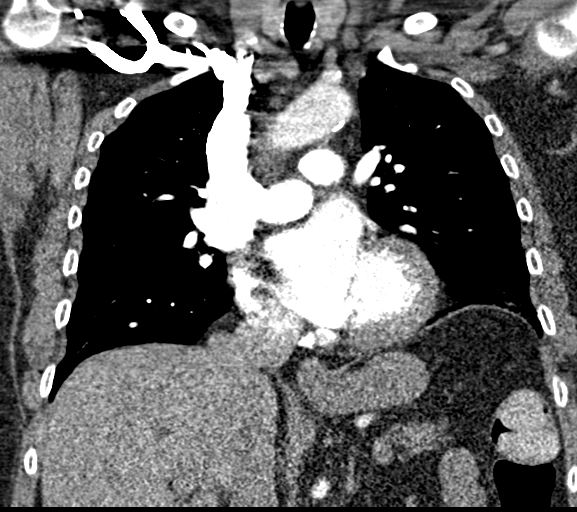
[im 77/103  soft-tissue]
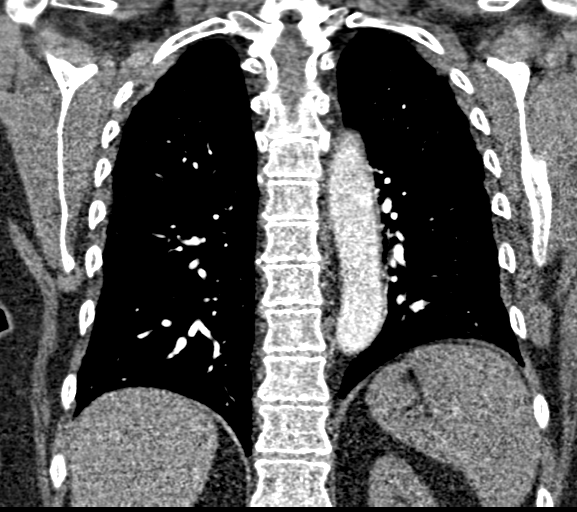

[19 of 46 positions shown; findings below may reference images not displayed]

FINDINGS: Cardiovascular: Satisfactory opacification of the pulmonary arteries
to the segmental level. No evidence of pulmonary embolism. Normal
heart size. No pericardial effusion. Aortic atherosclerosis.

Mediastinum/Nodes: No enlarged mediastinal, hilar, or axillary lymph
nodes. Thyroid gland, trachea, and esophagus demonstrate no
significant findings.

Lungs/Pleura: Lungs are clear. No pleural effusion or pneumothorax.

Upper Abdomen: Normal.

Musculoskeletal: No chest wall abnormality. No acute or significant
osseous findings.

Review of the MIP images confirms the above findings.
IMPRESSION: 1. No pulmonary emboli or other acute abnormalities.
2.  Aortic Atherosclerosis ([id number]).

## 2021-05-06 ENCOUNTER — Other Ambulatory Visit: Payer: Self-pay | Admitting: Internal Medicine

## 2021-06-07 ENCOUNTER — Other Ambulatory Visit: Payer: Self-pay

## 2021-06-07 ENCOUNTER — Ambulatory Visit (INDEPENDENT_AMBULATORY_CARE_PROVIDER_SITE_OTHER): Payer: No Typology Code available for payment source

## 2021-06-07 DIAGNOSIS — Z23 Encounter for immunization: Secondary | ICD-10-CM

## 2021-07-05 ENCOUNTER — Other Ambulatory Visit: Payer: Self-pay | Admitting: Internal Medicine

## 2021-07-05 NOTE — Telephone Encounter (Signed)
Singulair refilled Karin Golden

## 2021-07-05 NOTE — Telephone Encounter (Signed)
Dr. Maple Hudson please advise:  Would you like to refill montelukast 10mg   Nothing further

## 2021-10-29 ENCOUNTER — Other Ambulatory Visit: Payer: Self-pay | Admitting: Internal Medicine

## 2021-12-12 ENCOUNTER — Ambulatory Visit: Payer: No Typology Code available for payment source | Admitting: Internal Medicine

## 2021-12-27 ENCOUNTER — Other Ambulatory Visit: Payer: Self-pay | Admitting: Internal Medicine

## 2022-01-10 NOTE — Progress Notes (Signed)
Patient ID: Andrea [name], female    DOB: 1950-09-03, 71 y.o.   MRN: [id number]  HPI female never smoker followed for allergic rhinitis, asthma/NSAIDS, complicated by history DVT/PE PFT: 10/30/2011-normal spirometry flows and volumes(FEV1/FVC 0.88) with insignificant response to bronchodilator. Diffusion mildly reduced 76 EOS WNl 02/25/14 IgE 13.9 10/25/10 Lab 07/21/2018- EOS 0.2 WNL, Total IgE 14 WNL -----------------------------------------------------------   12/12/20- 71 year old female never smoker followed for allergic rhinitis, asthma/NSAIDS, Urticaria, complicated by history DVT/PE, allergy to pneumonia vaccine, GERD,  -Advair 500 ,  albuterol rescue inhaler and albuterol neb prn, Singulair Covid vax-3 Phizer Pending laparoscopic repair ventral hernia. -----Patient is going to be having hernia surgery and states that her night time asthma is getting worse. States she gets some pressure from it and it causes acid reflux which makes her asthma worse.  ACT score - 14 Discussed interaction Asthma and GERD. Last prednisone was during pollen surge early April. Advised her to avoid prednisone until after her surgery.  35/46/33- 71 year old female never smoker followed for allergic rhinitis, asthma/NSAIDS, Urticaria, complicated by history DVT/PE, allergy to pneumonia vaccine, GERD,  -Advair 500 ,  albuterol rescue inhaler and albuterol neb prn, Singulair, Prednisone 20 prn, Covid vax-4 Phizer Had repair incarcerated ventral hernia last summer. -----No issues breathing at this time IgE and Eos have not been elevated in past (steroid effect?). -----No issues breathing at this time Uses prednisone only rarely with acute events. Last was early this month. Had a cold after plane back from [place]. Had lap hernia repai- recovered. She is not interested in Biologics for now.  Review of Systems-see HPI    + = positive Constitutional:   No weight loss, night sweats,  Fevers, chills, fatigue,  lassitude. HEENT:   No headaches,  Difficulty swallowing,  Tooth/dental problems,  Sore throat,                No-sneezing, itching, ear ache, +nasal congestion, post nasal drip,  CV:  No chest pain,  Orthopnea, PND, swelling in lower extremities, anasarca, dizziness, palpitations GI  No heartburn, indigestion, abdominal pain, nausea, vomiting,  Resp: + shortness of breath at rest.  No excess mucus, no productive cough,  + Non-productive cough,  No coughing up of blood.   change in color of mucus.  recent wheezing.   Skin: no rash or lesions. GU:  MS:  No joint pain . Psych:  No change in mood or affect. No depression or anxiety.  No memory loss.  Objective:   Physical Exam General- Alert, Oriented, Affect-appropriate, Distress- none acute, +obese Skin- rash-none, lesions- none, excoriation- none; pale complexion Lymphadenopathy- none Head- atraumatic            Eyes- Gross vision intact, PERRLA, conjunctivae clear secretions            Ears- Hearing, canals normal            Nose- Clear, No-Septal dev, mucus, polyps, erosion, perforation             Throat- Mallampati II-III , mucosa clear , drainage- none, tonsils- atrophic Neck- flexible , trachea midline, no stridor , thyroid nl, carotid no bruit Chest - symmetrical excursion , unlabored           Heart/CV- RRR , no murmur , no gallop  , no rub, nl s1 s2                           -  JVD- none , edema+2 pitting, stasis changes- none, varices-            Lung-  Clear, wheeze-none  Cough- none , dullness-none, rub- none           Chest wall-  Abd-  Br/ Gen/ Rectal- Not done, not indicated Extrem- cyanosis- none, clubbing, none, atrophy- none, strength- nl Neuro- grossly intact to observation

## 2022-01-13 ENCOUNTER — Ambulatory Visit (INDEPENDENT_AMBULATORY_CARE_PROVIDER_SITE_OTHER): Payer: Medicare HMO | Admitting: Internal Medicine

## 2022-01-13 ENCOUNTER — Encounter: Payer: Self-pay | Admitting: Internal Medicine

## 2022-01-13 DIAGNOSIS — J302 Other seasonal allergic rhinitis: Secondary | ICD-10-CM | POA: Diagnosis not present

## 2022-01-13 DIAGNOSIS — J4540 Moderate persistent asthma, uncomplicated: Secondary | ICD-10-CM

## 2022-01-13 DIAGNOSIS — J3089 Other allergic rhinitis: Secondary | ICD-10-CM

## 2022-01-13 NOTE — Patient Instructions (Signed)
Glad that you can be a professional Grandma !  Please call if we can help

## 2022-01-14 ENCOUNTER — Other Ambulatory Visit: Payer: Self-pay | Admitting: Internal Medicine

## 2022-02-18 NOTE — Assessment & Plan Note (Signed)
Currently uncomplicated, with last prednisone few days 2-3 weeks ago. Occasional use of rescue inhaler.  Plan- continue present meds

## 2022-02-18 NOTE — Assessment & Plan Note (Signed)
Currently well-controlled- early summer pattern

## 2022-02-28 DIAGNOSIS — Z0000 Encounter for general adult medical examination without abnormal findings: Secondary | ICD-10-CM | POA: Diagnosis not present

## 2022-02-28 DIAGNOSIS — I10 Essential (primary) hypertension: Secondary | ICD-10-CM | POA: Diagnosis not present

## 2022-02-28 DIAGNOSIS — J4540 Moderate persistent asthma, uncomplicated: Secondary | ICD-10-CM | POA: Diagnosis not present

## 2022-02-28 DIAGNOSIS — G479 Sleep disorder, unspecified: Secondary | ICD-10-CM | POA: Diagnosis not present

## 2022-02-28 DIAGNOSIS — Z01419 Encounter for gynecological examination (general) (routine) without abnormal findings: Secondary | ICD-10-CM | POA: Diagnosis not present

## 2022-02-28 DIAGNOSIS — E782 Mixed hyperlipidemia: Secondary | ICD-10-CM | POA: Diagnosis not present

## 2022-02-28 DIAGNOSIS — Z1211 Encounter for screening for malignant neoplasm of colon: Secondary | ICD-10-CM | POA: Diagnosis not present

## 2022-02-28 DIAGNOSIS — M85859 Other specified disorders of bone density and structure, unspecified thigh: Secondary | ICD-10-CM | POA: Diagnosis not present

## 2022-03-07 DIAGNOSIS — I10 Essential (primary) hypertension: Secondary | ICD-10-CM | POA: Diagnosis not present

## 2022-03-07 DIAGNOSIS — Z6828 Body mass index (BMI) 28.0-28.9, adult: Secondary | ICD-10-CM | POA: Diagnosis not present

## 2022-03-07 DIAGNOSIS — M85859 Other specified disorders of bone density and structure, unspecified thigh: Secondary | ICD-10-CM | POA: Diagnosis not present

## 2022-03-07 DIAGNOSIS — E782 Mixed hyperlipidemia: Secondary | ICD-10-CM | POA: Diagnosis not present

## 2022-03-07 DIAGNOSIS — J4540 Moderate persistent asthma, uncomplicated: Secondary | ICD-10-CM | POA: Diagnosis not present

## 2022-03-18 DIAGNOSIS — M8589 Other specified disorders of bone density and structure, multiple sites: Secondary | ICD-10-CM | POA: Diagnosis not present

## 2022-03-25 DIAGNOSIS — H1013 Acute atopic conjunctivitis, bilateral: Secondary | ICD-10-CM | POA: Diagnosis not present

## 2022-04-10 ENCOUNTER — Telehealth: Payer: Self-pay | Admitting: Internal Medicine

## 2022-04-14 NOTE — Telephone Encounter (Signed)
Called and spoke with pt letting her know the info from [facility] and she verbalized understanding. Nothing further needed.

## 2022-04-14 NOTE — Telephone Encounter (Signed)
Probably a good idea to get RSV vaccine if she can.

## 2022-04-14 NOTE — Telephone Encounter (Signed)
Dr. Maple Hudson, please advise if you think pt should get the RSV vaccine.

## 2022-04-16 DIAGNOSIS — Z23 Encounter for immunization: Secondary | ICD-10-CM | POA: Diagnosis not present

## 2022-07-07 ENCOUNTER — Other Ambulatory Visit: Payer: Self-pay | Admitting: Internal Medicine

## 2022-07-08 NOTE — Telephone Encounter (Signed)
Singulair refilled at [facility]

## 2022-07-17 DIAGNOSIS — Z23 Encounter for immunization: Secondary | ICD-10-CM | POA: Diagnosis not present

## 2022-09-11 DIAGNOSIS — E782 Mixed hyperlipidemia: Secondary | ICD-10-CM | POA: Diagnosis not present

## 2022-09-11 DIAGNOSIS — J4540 Moderate persistent asthma, uncomplicated: Secondary | ICD-10-CM | POA: Diagnosis not present

## 2022-09-11 DIAGNOSIS — G479 Sleep disorder, unspecified: Secondary | ICD-10-CM | POA: Diagnosis not present

## 2022-09-11 DIAGNOSIS — Z6828 Body mass index (BMI) 28.0-28.9, adult: Secondary | ICD-10-CM | POA: Diagnosis not present

## 2022-09-11 DIAGNOSIS — M85859 Other specified disorders of bone density and structure, unspecified thigh: Secondary | ICD-10-CM | POA: Diagnosis not present

## 2022-09-11 DIAGNOSIS — I10 Essential (primary) hypertension: Secondary | ICD-10-CM | POA: Diagnosis not present

## 2022-09-30 ENCOUNTER — Other Ambulatory Visit: Payer: Self-pay | Admitting: *Deleted

## 2022-09-30 MED ORDER — FLUTICASONE-SALMETEROL 500-50 MCG/ACT IN AEPB: 1.0000 | INHALATION_SPRAY | Freq: Two times a day (BID) | RESPIRATORY_TRACT | 3 refills | Status: DC

## 2022-12-30 ENCOUNTER — Telehealth: Payer: Self-pay | Admitting: Internal Medicine

## 2022-12-30 MED ORDER — ALBUTEROL SULFATE HFA 108 (90 BASE) MCG/ACT IN AERS: INHALATION_SPRAY | RESPIRATORY_TRACT | 10 refills | Status: DC

## 2022-12-30 MED ORDER — ALBUTEROL SULFATE 0.63 MG/3ML IN NEBU: INHALATION_SOLUTION | RESPIRATORY_TRACT | 11 refills | Status: DC

## 2022-12-30 NOTE — Telephone Encounter (Signed)
   albuterol (ACCUNEB) 0.63 MG/3ML nebulizer solution  albuterol (VENTOLIN HFA) 108 (90 Base) MCG/ACT inhaler    Pt needs a refill for her Prednisone nebulizer solution  [facility] [address]

## 2022-12-30 NOTE — Telephone Encounter (Signed)
Called and spoke with pt to verify which meds she needed refilled and have sent meds to preferred pharmacy. Nothing further needed.

## 2023-01-07 DIAGNOSIS — R410 Disorientation, unspecified: Secondary | ICD-10-CM | POA: Diagnosis not present

## 2023-01-07 DIAGNOSIS — R4182 Altered mental status, unspecified: Secondary | ICD-10-CM | POA: Diagnosis not present

## 2023-01-07 DIAGNOSIS — R269 Unspecified abnormalities of gait and mobility: Secondary | ICD-10-CM | POA: Diagnosis not present

## 2023-01-07 DIAGNOSIS — N3000 Acute cystitis without hematuria: Secondary | ICD-10-CM | POA: Diagnosis not present

## 2023-01-07 DIAGNOSIS — R2681 Unsteadiness on feet: Secondary | ICD-10-CM | POA: Diagnosis not present

## 2023-01-07 DIAGNOSIS — E785 Hyperlipidemia, unspecified: Secondary | ICD-10-CM | POA: Diagnosis not present

## 2023-01-07 DIAGNOSIS — R059 Cough, unspecified: Secondary | ICD-10-CM | POA: Diagnosis not present

## 2023-01-07 DIAGNOSIS — E876 Hypokalemia: Secondary | ICD-10-CM | POA: Diagnosis not present

## 2023-01-07 DIAGNOSIS — J45909 Unspecified asthma, uncomplicated: Secondary | ICD-10-CM | POA: Diagnosis not present

## 2023-01-07 DIAGNOSIS — I10 Essential (primary) hypertension: Secondary | ICD-10-CM | POA: Diagnosis not present

## 2023-01-10 DIAGNOSIS — R2681 Unsteadiness on feet: Secondary | ICD-10-CM | POA: Diagnosis not present

## 2023-01-13 NOTE — Progress Notes (Unsigned)
Patient ID: Andrea [name], female    DOB: [birth year], 72 y.o.   MRN: [id number]  HPI female never smoker followed for allergic rhinitis, asthma/NSAIDS, complicated by history DVT/PE PFT: 10/30/2011-normal spirometry flows and volumes(FEV1/FVC 0.88) with insignificant response to bronchodilator. Diffusion mildly reduced 76 EOS WNl 02/25/14 IgE 13.9 10/25/10 Lab 07/21/2018- EOS 0.2 WNL, Total IgE 14 WNL -----------------------------------------------------------   64/65/75- 72 year old female never smoker followed for allergic rhinitis, asthma/NSAIDS, Urticaria, complicated by history DVT/PE, allergy to pneumonia vaccine, GERD,  -Advair 500 ,  albuterol rescue inhaler and albuterol neb prn, Singulair, Prednisone 20 prn, Covid vax-4 Phizer Had repair incarcerated ventral hernia last summer. -----No issues breathing at this time IgE and Eos have not been elevated in past (steroid effect?). -----No issues breathing at this time Uses prednisone only rarely with acute events. Last was early this month. Had a cold after plane back from [place]. Had lap hernia repai- recovered. She is not interested in Biologics for now.  01/15/23- 72 year old female never smoker followed for allergic rhinitis, asthma/NSAIDS, Urticaria, complicated by history DVT/PE, allergy to pneumonia vaccine, GERD,  -Advair 500 ,  albuterol rescue inhaler and albuterol neb prn, Singulair, Prednisone 20 prn, ED recently for unsteady gait. ------Had a severe cold back in May- pt states she is feeling much better since then  She used her nebulizer machine and some prednisone to help get through the viral respiratory exacerbation in May and feels back to normal now.  She remains on Advair 500 and we discussed trying one of the triple inhalers to see if we could reduce the inhaled steroid component.  We will give her Markus Daft if available.  Review of Systems-see HPI    + = positive Constitutional:   No weight loss, night sweats,   Fevers, chills, fatigue, lassitude. HEENT:   No headaches,  Difficulty swallowing,  Tooth/dental problems,  Sore throat,                No-sneezing, itching, ear ache, +nasal congestion, post nasal drip,  CV:  No chest pain,  Orthopnea, PND, swelling in lower extremities, anasarca, dizziness, palpitations GI  No heartburn, indigestion, abdominal pain, nausea, vomiting,  Resp:  shortness of breath at rest.  No excess mucus, no productive cough,   Non-productive cough,  No coughing up of blood.   change in color of mucus.  recent wheezing.   Skin: no rash or lesions. GU:  MS:  No joint pain . Psych:  No change in mood or affect. No depression or anxiety.  No memory loss.  Objective:   Physical Exam General- Alert, Oriented, Affect-appropriate, Distress- none acute,  Skin- rash-none, lesions- none, excoriation- none; pale complexion Lymphadenopathy- none Head- atraumatic            Eyes- Gross vision intact, PERRLA, conjunctivae clear secretions            Ears- Hearing, canals normal            Nose- Clear, No-Septal dev, mucus, polyps, erosion, perforation             Throat- Mallampati II-III , mucosa clear , drainage- none, tonsils- atrophic Neck- flexible , trachea midline, no stridor , thyroid nl, carotid no bruit Chest - symmetrical excursion , unlabored           Heart/CV- RRR , no murmur , no gallop  , no rub, nl s1 s2                           -  JVD- none , edema+2 pitting, stasis changes- none, varices-            Lung-  Clear, wheeze-none  Cough- none , dullness-none, rub- none           Chest wall-  Abd-  Br/ Gen/ Rectal- Not done, not indicated Extrem- cyanosis- none, clubbing, none, atrophy- none, strength- nl Neuro- grossly intact to observation

## 2023-01-15 ENCOUNTER — Encounter: Payer: Self-pay | Admitting: Internal Medicine

## 2023-01-15 ENCOUNTER — Ambulatory Visit: Payer: Medicare HMO | Admitting: Internal Medicine

## 2023-01-15 VITALS — BP 120/72 | HR 99 | Ht 63.5 in | Wt 161.8 lb

## 2023-01-15 DIAGNOSIS — J3089 Other allergic rhinitis: Secondary | ICD-10-CM | POA: Diagnosis not present

## 2023-01-15 DIAGNOSIS — J4540 Moderate persistent asthma, uncomplicated: Secondary | ICD-10-CM | POA: Diagnosis not present

## 2023-01-15 DIAGNOSIS — J302 Other seasonal allergic rhinitis: Secondary | ICD-10-CM | POA: Diagnosis not present

## 2023-01-15 MED ORDER — BREZTRI AEROSPHERE 160-9-4.8 MCG/ACT IN AERO: 2.0000 | INHALATION_SPRAY | Freq: Two times a day (BID) | RESPIRATORY_TRACT | 0 refills | Status: AC

## 2023-01-15 NOTE — Assessment & Plan Note (Signed)
Try samples of Breztri.  She will check cost and decide if she wants to stay on this or revert to Advair 500.

## 2023-01-15 NOTE — Addendum Note (Signed)
Addended by: Erick Blinks A on: 01/15/2023 10:28 AM   Modules accepted: Orders

## 2023-01-15 NOTE — Assessment & Plan Note (Signed)
I would summer season she is not experiencing much problem.  Watch need for Flonase/antihistamine.

## 2023-01-15 NOTE — Patient Instructions (Signed)
Order- sample x 2 Breztri inhaler    inhale 2 puffs then rinse mouth, twice daily   Try this instead of Advair. You can ask your pharmacy what it would cost. If you decide to switch to it we can send a prescription.

## 2023-02-27 DIAGNOSIS — Z1231 Encounter for screening mammogram for malignant neoplasm of breast: Secondary | ICD-10-CM | POA: Diagnosis not present

## 2023-03-03 DIAGNOSIS — E782 Mixed hyperlipidemia: Secondary | ICD-10-CM | POA: Diagnosis not present

## 2023-03-03 DIAGNOSIS — Z0000 Encounter for general adult medical examination without abnormal findings: Secondary | ICD-10-CM | POA: Diagnosis not present

## 2023-03-03 DIAGNOSIS — M85859 Other specified disorders of bone density and structure, unspecified thigh: Secondary | ICD-10-CM | POA: Diagnosis not present

## 2023-03-03 DIAGNOSIS — E663 Overweight: Secondary | ICD-10-CM | POA: Diagnosis not present

## 2023-03-03 DIAGNOSIS — Z9181 History of falling: Secondary | ICD-10-CM | POA: Diagnosis not present

## 2023-03-03 DIAGNOSIS — I10 Essential (primary) hypertension: Secondary | ICD-10-CM | POA: Diagnosis not present

## 2023-03-12 DIAGNOSIS — T7840XA Allergy, unspecified, initial encounter: Secondary | ICD-10-CM | POA: Diagnosis not present

## 2023-03-12 DIAGNOSIS — K219 Gastro-esophageal reflux disease without esophagitis: Secondary | ICD-10-CM | POA: Diagnosis not present

## 2023-03-12 DIAGNOSIS — Z6828 Body mass index (BMI) 28.0-28.9, adult: Secondary | ICD-10-CM | POA: Diagnosis not present

## 2023-03-12 DIAGNOSIS — I10 Essential (primary) hypertension: Secondary | ICD-10-CM | POA: Diagnosis not present

## 2023-03-12 DIAGNOSIS — M818 Other osteoporosis without current pathological fracture: Secondary | ICD-10-CM | POA: Diagnosis not present

## 2023-03-12 DIAGNOSIS — E782 Mixed hyperlipidemia: Secondary | ICD-10-CM | POA: Diagnosis not present

## 2023-03-12 DIAGNOSIS — Z636 Dependent relative needing care at home: Secondary | ICD-10-CM | POA: Diagnosis not present

## 2023-03-12 DIAGNOSIS — J4540 Moderate persistent asthma, uncomplicated: Secondary | ICD-10-CM | POA: Diagnosis not present

## 2023-04-24 DIAGNOSIS — Z23 Encounter for immunization: Secondary | ICD-10-CM | POA: Diagnosis not present

## 2023-07-23 ENCOUNTER — Other Ambulatory Visit: Payer: Self-pay | Admitting: Internal Medicine

## 2023-07-24 MED ORDER — FLUTICASONE PROPIONATE 50 MCG/ACT NA SUSP: NASAL | 12 refills | Status: DC

## 2023-07-24 MED ORDER — MONTELUKAST SODIUM 10 MG PO TABS: 10.0000 mg | ORAL_TABLET | Freq: Every day | ORAL | 12 refills | Status: DC

## 2023-07-24 NOTE — Telephone Encounter (Signed)
Pt is requesting refills on Singulair and Flonase. The lov did not specify if she was to continue or stop the Singulair. The lov did state to watch the need for Flonase. Do you want to allow refills?

## 2023-07-24 NOTE — Telephone Encounter (Signed)
Flonase and singulair refilled

## 2023-08-02 DIAGNOSIS — H43393 Other vitreous opacities, bilateral: Secondary | ICD-10-CM | POA: Diagnosis not present

## 2023-08-02 DIAGNOSIS — Z0100 Encounter for examination of eyes and vision without abnormal findings: Secondary | ICD-10-CM | POA: Diagnosis not present

## 2023-10-16 DIAGNOSIS — M25551 Pain in right hip: Secondary | ICD-10-CM | POA: Diagnosis not present

## 2023-10-19 DIAGNOSIS — M25551 Pain in right hip: Secondary | ICD-10-CM | POA: Diagnosis not present

## 2023-10-22 DIAGNOSIS — M25551 Pain in right hip: Secondary | ICD-10-CM | POA: Diagnosis not present

## 2023-10-26 DIAGNOSIS — M25551 Pain in right hip: Secondary | ICD-10-CM | POA: Diagnosis not present

## 2023-11-09 ENCOUNTER — Other Ambulatory Visit: Payer: Self-pay | Admitting: Internal Medicine

## 2023-11-09 DIAGNOSIS — M25551 Pain in right hip: Secondary | ICD-10-CM | POA: Diagnosis not present

## 2023-11-09 MED ORDER — FLUTICASONE PROPIONATE 50 MCG/ACT NA SUSP: NASAL | 12 refills | Status: DC

## 2023-11-09 MED ORDER — FLUTICASONE-SALMETEROL 500-50 MCG/ACT IN AEPB: 1.0000 | INHALATION_SPRAY | Freq: Two times a day (BID) | RESPIRATORY_TRACT | 3 refills | Status: DC

## 2023-11-09 NOTE — Telephone Encounter (Signed)
Refill scripts sent.

## 2023-11-09 NOTE — Telephone Encounter (Signed)
 Called pt says she didn't like the breztri when given samples at last visit prefers the advair

## 2023-11-09 NOTE — Telephone Encounter (Signed)
 Last Fill: Flonase: 07/24/23    Advair: 09/30/22  Last OV: 01/15/23 Next OV: 01/18/24  Routing to provider for review/authorization.

## 2023-11-09 NOTE — Telephone Encounter (Unsigned)
 Copied from CRM [id number]. Topic: Clinical - Medication Refill >> Nov 09, 2023  1:25 PM Chantha C wrote: Most Recent Pulmonary Care Visit: 01/13/22  Medication: fluticasone (FLONASE) 50 MCG/ACT nasal spray and fluticasone-salmeterol (ADVAIR DISKUS) 500-50 MCG/ACT AEPB  Has the patient contacted their pharmacy? Yes, patient [id number] was denied refills at the pharmacy (Agent: If no, request that the patient contact the pharmacy for the refill. If patient does not wish to contact the pharmacy document the reason why and proceed with request.) (Agent: If yes, when and what did the pharmacy advise?)  Is this the correct pharmacy for this prescription? Yes If no, delete pharmacy and type the correct one.  This is the patient's preferred pharmacy:  [facility] [id number] - [place], [place] - [address] [place] [address] Phone: [phone] Fax: [phone]   Has the prescription been filled recently? No  Is the patient out of the medication? Yes, patient states totally out of Advair and asking for refills to be done today  Has the patient been seen for an appointment in the last year OR does the patient have an upcoming appointment? Yes, 01/18/24  Can we respond through MyChart? No, please call [phone]  Agent: Please be advised that Rx refills may take up to 3 business days. We ask that you follow-up with your pharmacy.

## 2023-11-09 NOTE — Telephone Encounter (Signed)
 Please tell patient that drug store is asking for refill on Advair. Does she want [place] to fill that, or is she using Breztri?

## 2023-11-09 NOTE — Telephone Encounter (Signed)
 Advise If appropriate to continue advair was advised to switch to breztri per last avs

## 2023-11-12 DIAGNOSIS — M25551 Pain in right hip: Secondary | ICD-10-CM | POA: Diagnosis not present

## 2023-11-16 DIAGNOSIS — M25551 Pain in right hip: Secondary | ICD-10-CM | POA: Diagnosis not present

## 2023-11-23 DIAGNOSIS — M25551 Pain in right hip: Secondary | ICD-10-CM | POA: Diagnosis not present

## 2023-11-26 DIAGNOSIS — M25551 Pain in right hip: Secondary | ICD-10-CM | POA: Diagnosis not present

## 2023-12-07 DIAGNOSIS — M25551 Pain in right hip: Secondary | ICD-10-CM | POA: Diagnosis not present

## 2023-12-14 DIAGNOSIS — S50861A Insect bite (nonvenomous) of right forearm, initial encounter: Secondary | ICD-10-CM | POA: Diagnosis not present

## 2023-12-14 DIAGNOSIS — S50862A Insect bite (nonvenomous) of left forearm, initial encounter: Secondary | ICD-10-CM | POA: Diagnosis not present

## 2023-12-14 DIAGNOSIS — W57XXXA Bitten or stung by nonvenomous insect and other nonvenomous arthropods, initial encounter: Secondary | ICD-10-CM | POA: Diagnosis not present

## 2024-01-17 NOTE — Progress Notes (Unsigned)
 Patient ID: Andrea [name], female    DOB: 07-Oct-1950, 73 y.o.   MRN: [id number]  HPI female never smoker followed for allergic rhinitis, asthma/NSAIDS, complicated by history DVT/PE PFT: 10/30/2011-normal spirometry flows and volumes(FEV1/FVC 0.88) with insignificant response to bronchodilator. Diffusion mildly reduced 76 EOS WNl 02/25/14 IgE 13.9 10/25/10 Lab 07/21/2018- EOS 0.2 WNL, Total IgE 14 WNL -----------------------------------------------------------  01/15/23- 73 year old female never smoker followed for allergic rhinitis, asthma/NSAIDS, Urticaria, complicated by history DVT/PE, allergy to pneumonia vaccine, GERD,  -Advair 500 ,  albuterol  rescue inhaler and albuterol  neb prn, Singulair , Prednisone  20 prn, ED recently for unsteady gait. ------Had a severe cold back in May- pt states she is feeling much better since then  She used her nebulizer machine and some prednisone  to help get through the viral respiratory exacerbation in May and feels back to normal now.  She remains on Advair 500 and we discussed trying one of the triple inhalers to see if we could reduce the inhaled steroid component.  We will give her Breztri  if available.  01/18/24- 73 year old female never smoker followed for allergic rhinitis, asthma/NSAIDS, Urticaria, complicated by history DVT/PE, allergy to pneumonia vaccine, GERD,  -Advair 500 ,  albuterol  rescue inhaler and albuterol  neb prn, Singulair , Prednisone  20 prn, -----Doing well.  Not waking up during night with asthma sx. Had tried Breztri  sample- overdried mouth, prefers Advair. Discussed the use of AI scribe software for clinical note transcription with the patient, who gave verbal consent to proceed.  History of Present Illness   Andrea [name] is a 73 year old female with asthma who presents for a follow-up visit.  She manages her asthma with Advair 500 and a rescue inhaler, especially during her daily walks. She transitioned to more  physical activity, walking three and a half to four miles daily, which aids in her asthma management. In February, she experienced an asthma flare-up due to a viral infection, requiring prednisone . Last month, she needed prednisone  again for severe bug bites. She prefers Advair over Breztri  due to dry mouth with Breztri . She recently increased her physical activity by walking daily with her dog. She feels great today and does not require any medication refills.     Assessment and Plan:    Asthma Asthma well-controlled with Advair. Recent exacerbations due to viral infection and bug bites required prednisone . Prefers Advair over Breztri  due to dry mouth side effect. No recent prednisone  beyond these episodes. Informed again about prednisone  risks. - Continue Advair. - Keep prednisone  for emergencies, use cautiously. - Follow-up in one year.   Allergic Rhinitis -continue Flonase      Review of Systems-see HPI    + = positive Constitutional:   No weight loss, night sweats,  Fevers, chills, fatigue, lassitude. HEENT:   No headaches,  Difficulty swallowing,  Tooth/dental problems,  Sore throat,                No-sneezing, itching, ear ache, +nasal congestion, post nasal drip,  CV:  No chest pain,  Orthopnea, PND, swelling in lower extremities, anasarca, dizziness, palpitations GI  No heartburn, indigestion, abdominal pain, nausea, vomiting,  Resp:  shortness of breath at rest.  No excess mucus, no productive cough,   Non-productive cough,  No coughing up of blood.   change in color of mucus.  recent wheezing.   Skin: no rash or lesions. GU:  MS:  No joint pain . Psych:  No change in mood or affect. No depression or anxiety.  No  memory loss.  Objective:   Physical Exam General- Alert, Oriented, Affect-appropriate, Distress- none acute,  Skin- rash-none, lesions- none, excoriation- none; pale complexion Lymphadenopathy- none Head- atraumatic            Eyes- Gross vision intact, PERRLA,  conjunctivae clear secretions            Ears- Hearing, canals normal            Nose- Clear, No-Septal dev, mucus, polyps, erosion, perforation             Throat- Mallampati II-III , mucosa clear , drainage- none, tonsils- atrophic Neck- flexible , trachea midline, no stridor , thyroid nl, carotid no bruit Chest - symmetrical excursion , unlabored           Heart/CV- RRR , no murmur , no gallop  , no rub, nl s1 s2                           - JVD- none , edema-none, stasis changes- none, varices-            Lung-  Clear, wheeze-none  Cough- none , dullness-none, rub- none           Chest wall-  Abd-  Br/ Gen/ Rectal- Not done, not indicated Extrem- cyanosis- none, clubbing, none, atrophy- none, strength- nl Neuro- grossly intact to observation

## 2024-01-18 ENCOUNTER — Ambulatory Visit: Payer: PPO | Admitting: Internal Medicine

## 2024-01-18 ENCOUNTER — Encounter: Payer: Self-pay | Admitting: Internal Medicine

## 2024-01-18 VITALS — BP 120/64 | HR 80 | Temp 98.5°F | Ht 63.0 in | Wt 150.8 lb

## 2024-01-18 DIAGNOSIS — J45909 Unspecified asthma, uncomplicated: Secondary | ICD-10-CM | POA: Diagnosis not present

## 2024-01-18 DIAGNOSIS — J4540 Moderate persistent asthma, uncomplicated: Secondary | ICD-10-CM

## 2024-01-18 NOTE — Patient Instructions (Signed)
 Glad you are doing well - we can continue current meds.  Please call if we can help

## 2024-01-22 ENCOUNTER — Other Ambulatory Visit: Payer: Self-pay | Admitting: Internal Medicine

## 2024-01-22 NOTE — Telephone Encounter (Signed)
 Copied from CRM [id number]. Topic: Clinical - Medication Refill >> Jan 22, 2024  2:35 PM Hilton Lucky wrote: Medication:  montelukast  (SINGULAIR ) 10 MG tablet fluticasone  (FLONASE ) 50 MCG/ACT nasal spray fluticasone -salmeterol (ADVAIR DISKUS) 500-50 MCG/ACT AEPB Prednisone  20mg  - normally 5 to 6 refills for a year  Has the patient contacted their pharmacy? No  This is the patient's preferred pharmacy:  [facility] [id number] - [place], [place] - [address] [place] [address] Phone: [phone] Fax: [phone]  Is this the correct pharmacy for this prescription? Yes If no, delete pharmacy and type the correct one.   Has the prescription been filled recently? No  Is the patient out of the medication? Yes - some are expired  Has the patient been seen for an appointment in the last year OR does the patient have an upcoming appointment? Yes  Can we respond through MyChart? Yes  Agent: Please be advised that Rx refills may take up to 3 business days. We ask that you follow-up with your pharmacy.  Note: Patient is not needed in-office outside of a one-year checkup and would like refills of the above before her physician retires.

## 2024-01-25 MED ORDER — MONTELUKAST SODIUM 10 MG PO TABS: 10.0000 mg | ORAL_TABLET | Freq: Every day | ORAL | 12 refills | Status: AC

## 2024-01-25 MED ORDER — FLUTICASONE PROPIONATE 50 MCG/ACT NA SUSP: NASAL | 12 refills | Status: AC

## 2024-01-25 MED ORDER — FLUTICASONE-SALMETEROL 500-50 MCG/ACT IN AEPB: 1.0000 | INHALATION_SPRAY | Freq: Two times a day (BID) | RESPIRATORY_TRACT | 3 refills | Status: AC

## 2024-03-09 DIAGNOSIS — Z1331 Encounter for screening for depression: Secondary | ICD-10-CM | POA: Diagnosis not present

## 2024-03-09 DIAGNOSIS — Z0000 Encounter for general adult medical examination without abnormal findings: Secondary | ICD-10-CM | POA: Diagnosis not present

## 2024-03-11 DIAGNOSIS — E782 Mixed hyperlipidemia: Secondary | ICD-10-CM | POA: Diagnosis not present

## 2024-03-11 DIAGNOSIS — M818 Other osteoporosis without current pathological fracture: Secondary | ICD-10-CM | POA: Diagnosis not present

## 2024-03-17 DIAGNOSIS — Z6826 Body mass index (BMI) 26.0-26.9, adult: Secondary | ICD-10-CM | POA: Diagnosis not present

## 2024-03-17 DIAGNOSIS — Z0000 Encounter for general adult medical examination without abnormal findings: Secondary | ICD-10-CM | POA: Diagnosis not present

## 2024-03-17 DIAGNOSIS — R351 Nocturia: Secondary | ICD-10-CM | POA: Diagnosis not present

## 2024-03-17 DIAGNOSIS — M818 Other osteoporosis without current pathological fracture: Secondary | ICD-10-CM | POA: Diagnosis not present

## 2024-03-17 DIAGNOSIS — J4540 Moderate persistent asthma, uncomplicated: Secondary | ICD-10-CM | POA: Diagnosis not present

## 2024-03-17 DIAGNOSIS — I10 Essential (primary) hypertension: Secondary | ICD-10-CM | POA: Diagnosis not present

## 2024-03-17 DIAGNOSIS — W57XXXA Bitten or stung by nonvenomous insect and other nonvenomous arthropods, initial encounter: Secondary | ICD-10-CM | POA: Diagnosis not present

## 2024-03-17 DIAGNOSIS — G479 Sleep disorder, unspecified: Secondary | ICD-10-CM | POA: Diagnosis not present

## 2024-03-17 DIAGNOSIS — E782 Mixed hyperlipidemia: Secondary | ICD-10-CM | POA: Diagnosis not present

## 2024-04-11 DIAGNOSIS — Z1231 Encounter for screening mammogram for malignant neoplasm of breast: Secondary | ICD-10-CM | POA: Diagnosis not present

## 2024-04-11 DIAGNOSIS — M8589 Other specified disorders of bone density and structure, multiple sites: Secondary | ICD-10-CM | POA: Diagnosis not present

## 2024-04-18 ENCOUNTER — Other Ambulatory Visit: Payer: Self-pay | Admitting: Internal Medicine

## 2024-04-18 NOTE — Telephone Encounter (Signed)
 Copied from CRM #[id number]. Topic: Clinical - Medication Refill >> Apr 18, 2024 10:53 AM Essie A wrote: Medication: albuterol  (ACCUNEB ) 0.63 MG/3ML nebulizer solution; albuterol  (VENTOLIN  HFA) 108 (90 Base) MCG/ACT inhaler  Has the patient contacted their pharmacy? Yes (Agent: If no, request that the patient contact the pharmacy for the refill. If patient does not wish to contact the pharmacy document the reason why and proceed with request.) (Agent: If yes, when and what did the pharmacy advise?)  This is the patient's preferred pharmacy:  [facility] [id number] - [place], [place] - [address] [place] [address] Phone: [phone] Fax: [phone]  Is this the correct pharmacy for this prescription? Yes If no, delete pharmacy and type the correct one.   Has the prescription been filled recently? Yes  Is the patient out of the medication? No, will be going on a trip in 10 days and will these filled  Has the patient been seen for an appointment in the last year OR does the patient have an upcoming appointment? Yes  Can we respond through MyChart? No  Agent: Please be advised that Rx refills may take up to 3 business days. We ask that you follow-up with your pharmacy.

## 2024-04-19 MED ORDER — ALBUTEROL SULFATE 0.63 MG/3ML IN NEBU: INHALATION_SOLUTION | RESPIRATORY_TRACT | 11 refills | Status: AC

## 2024-04-19 MED ORDER — ALBUTEROL SULFATE HFA 108 (90 BASE) MCG/ACT IN AERS: INHALATION_SPRAY | RESPIRATORY_TRACT | 10 refills | Status: AC

## 2024-08-19 ENCOUNTER — Other Ambulatory Visit: Payer: Self-pay | Admitting: Internal Medicine

## 2024-08-24 ENCOUNTER — Telehealth: Payer: Self-pay

## 2024-08-24 NOTE — Telephone Encounter (Signed)
 Received Rx request from [facility] on [facility] for prednisone , 20mg  daily.   Previous Dr. Neysa pt. Per LOV notes: - Keep prednisone  for emergencies, use cautiously.  Please advise if it is okay to refill prednisone . Routing to Dr. Pawar, as Dr. Neysa has since retired.

## 2024-09-02 ENCOUNTER — Telehealth: Payer: Self-pay

## 2024-09-02 ENCOUNTER — Other Ambulatory Visit: Payer: Self-pay

## 2024-09-02 MED ORDER — PREDNISONE 20 MG PO TABS: 20.0000 mg | ORAL_TABLET | Freq: Every day | ORAL | 0 refills | Status: AC

## 2024-09-02 NOTE — Telephone Encounter (Signed)
 Copied from CRM [id number]. Topic: Clinical - Prescription Issue >> Sep 02, 2024  1:18 PM Rozanna MATSU wrote: Reason for CRM: pt called to get predniSONE  (DELTASONE ) 20 MG tablet but expired and Dr Neysa did not extend before leaving. Please send [facility] [id number] [place], [place] - [address] [place], [place] [address] Phone: [phone]  Fax: [phone]     Tried to reach out to patient VM/ Lm- return call   ( Rx sent to pharmacy per MD phone note 08/24/24)

## 2024-09-02 NOTE — Telephone Encounter (Unsigned)
 Copied from CRM [id number]. Topic: Appointments - Transfer of Care >> Sep 02, 2024  1:17 PM Rozanna MATSU wrote: Pt is requesting to transfer FROM: [facility] Pt is requesting to transfer TO: Pawar Reason for requested transfer: Provider retired It is the responsibility of the team the patient would like to transfer to (Dr. Theodoro) to reach out to the patient if for any reason this transfer is not acceptable.

## 2024-09-07 NOTE — Telephone Encounter (Signed)
 Refilled - see orders from 09/02/24. NFN.

## 2024-10-26 ENCOUNTER — Encounter
